# Patient Record
Sex: Female | Born: 1985 | Race: White | Hispanic: No | Marital: Married | Smoking: Never smoker
Health system: Southern US, Community
[De-identification: ages and names within clinical notes are randomized; demographics above are authoritative.]

## PROBLEM LIST (undated history)

## (undated) DIAGNOSIS — Z87442 Personal history of urinary calculi: Secondary | ICD-10-CM

## (undated) DIAGNOSIS — Z9049 Acquired absence of other specified parts of digestive tract: Secondary | ICD-10-CM

## (undated) DIAGNOSIS — N200 Calculus of kidney: Secondary | ICD-10-CM

## (undated) HISTORY — DX: Calculus of kidney: N20.0

## (undated) HISTORY — PX: CHOLECYSTECTOMY: SHX55

---
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
## 2007-06-15 ENCOUNTER — Encounter: Payer: Self-pay | Admitting: Maternal & Fetal Medicine

## 2007-07-07 ENCOUNTER — Ambulatory Visit: Payer: Self-pay | Admitting: Obstetrics and Gynecology

## 2007-07-10 ENCOUNTER — Ambulatory Visit: Payer: Self-pay | Admitting: Obstetrics and Gynecology

## 2007-08-31 ENCOUNTER — Ambulatory Visit: Payer: Self-pay | Admitting: Obstetrics and Gynecology

## 2007-08-31 ENCOUNTER — Ambulatory Visit: Payer: Self-pay

## 2007-12-22 ENCOUNTER — Observation Stay: Payer: Self-pay

## 2007-12-23 ENCOUNTER — Inpatient Hospital Stay: Payer: Self-pay

## 2008-01-10 ENCOUNTER — Emergency Department: Payer: Self-pay | Admitting: Emergency Medicine

## 2008-01-23 DIAGNOSIS — Z9049 Acquired absence of other specified parts of digestive tract: Secondary | ICD-10-CM

## 2008-01-23 HISTORY — DX: Acquired absence of other specified parts of digestive tract: Z90.49

## 2008-02-08 ENCOUNTER — Ambulatory Visit: Payer: Self-pay | Admitting: Surgery

## 2008-02-11 ENCOUNTER — Ambulatory Visit: Payer: Self-pay | Admitting: Surgery

## 2011-09-07 ENCOUNTER — Ambulatory Visit: Payer: Self-pay

## 2011-09-07 LAB — RAPID INFLUENZA A&B ANTIGENS

## 2013-09-25 ENCOUNTER — Ambulatory Visit: Payer: Self-pay | Admitting: Emergency Medicine

## 2013-09-25 LAB — URINALYSIS, COMPLETE
BILIRUBIN, UR: NEGATIVE
Glucose,UR: NEGATIVE mg/dL (ref 0–75)
KETONE: NEGATIVE
LEUKOCYTE ESTERASE: NEGATIVE
Nitrite: NEGATIVE
PROTEIN: NEGATIVE
Ph: 7 (ref 4.5–8.0)
Specific Gravity: 1.01 (ref 1.003–1.030)

## 2013-09-25 LAB — CBC WITH DIFFERENTIAL/PLATELET
Basophil #: 0.1 10*3/uL (ref 0.0–0.1)
Basophil %: 0.7 %
Eosinophil #: 0.2 10*3/uL (ref 0.0–0.7)
Eosinophil %: 1.8 %
HCT: 38.7 % (ref 35.0–47.0)
HGB: 13.1 g/dL (ref 12.0–16.0)
Lymphocyte #: 2.2 10*3/uL (ref 1.0–3.6)
Lymphocyte %: 24.6 %
MCH: 30.3 pg (ref 26.0–34.0)
MCHC: 34 g/dL (ref 32.0–36.0)
MCV: 89 fL (ref 80–100)
Monocyte #: 0.7 x10 3/mm (ref 0.2–0.9)
Monocyte %: 7.9 %
Neutrophil #: 5.9 10*3/uL (ref 1.4–6.5)
Neutrophil %: 65 %
Platelet: 264 10*3/uL (ref 150–440)
RBC: 4.33 10*6/uL (ref 3.80–5.20)
RDW: 12.4 % (ref 11.5–14.5)
WBC: 9.1 10*3/uL (ref 3.6–11.0)

## 2013-09-25 LAB — WET PREP, GENITAL

## 2013-09-25 LAB — GC/CHLAMYDIA PROBE AMP

## 2013-09-27 LAB — URINE CULTURE

## 2015-03-02 DIAGNOSIS — O09299 Supervision of pregnancy with other poor reproductive or obstetric history, unspecified trimester: Secondary | ICD-10-CM | POA: Insufficient documentation

## 2015-03-02 DIAGNOSIS — O200 Threatened abortion: Secondary | ICD-10-CM | POA: Insufficient documentation

## 2015-06-24 ENCOUNTER — Ambulatory Visit
Admission: EM | Admit: 2015-06-24 | Discharge: 2015-06-24 | Disposition: A | Discharge status: Home / Self Care | Payer: BLUE CROSS/BLUE SHIELD | Attending: Family Medicine | Admitting: Family Medicine

## 2015-06-24 ENCOUNTER — Encounter: Payer: Self-pay | Admitting: Emergency Medicine

## 2015-06-24 DIAGNOSIS — J069 Acute upper respiratory infection, unspecified: Secondary | ICD-10-CM | POA: Diagnosis not present

## 2015-06-24 LAB — RAPID STREP SCREEN (MED CTR MEBANE ONLY): STREPTOCOCCUS, GROUP A SCREEN (DIRECT): NEGATIVE

## 2015-06-24 MED ORDER — HYDROCOD POLST-CPM POLST ER 10-8 MG/5ML PO SUER: 5.0000 mL | Freq: Two times a day (BID) | ORAL | Status: DC

## 2015-06-24 NOTE — ED Provider Notes (Signed)
CSN: [id number]     Arrival date & time 06/24/15  1314 History   First MD Initiated Contact with Patient 06/24/15 1414     Chief Complaint  Patient presents with  . Sore Throat   (Consider location/radiation/quality/duration/timing/severity/associated sxs/prior Treatment) HPI   29 year old female resents with a 5 day history of a sore throat and cough with thick clear to yellow mucus. She had a fever of 99 is afebrile today. Pulse 92 respirations 14 blood pressure 112/73 O2 sat 96% on room air. She is coughing intermittently during the daytime and at nighttime is keeping her awake. Her father had the same symptoms that he is improved rapidly. He does list codeine as a allergy but states that that was when she was a very young child and her mother stated that she had hives. Not had any since that period time. She's never had any Vicodin in the past.  History reviewed. No pertinent past medical history. Past Surgical History  Procedure Laterality Date  . No past surgeries     No family history on file. Social History  Substance Use Topics  . Smoking status: Never Smoker   . Smokeless tobacco: Never Used  . Alcohol Use: No   OB History    No data available     Review of Systems  Constitutional: Positive for fever, chills and activity change. Negative for diaphoresis and fatigue.  HENT: Positive for congestion, postnasal drip, rhinorrhea, sinus pressure, sneezing and sore throat.   Respiratory: Positive for cough. Negative for shortness of breath, wheezing and stridor.   All other systems reviewed and are negative.   Allergies  Codeine  Home Medications   Prior to Admission medications   Medication Sig Start Date End Date Taking? Authorizing Provider  chlorpheniramine-HYDROcodone (TUSSIONEX PENNKINETIC ER) 10-8 MG/5ML SUER Take 5 mLs by mouth 2 (two) times daily. 06/24/15   Lutricia FeilWilliam P Evania Lyne, PA-C   Meds Ordered and Administered this Visit  Medications - No data to  display  BP 112/73 mmHg  Pulse 92  Temp(Src) 98.5 F (36.9 C)  Resp 14  Ht 5\' 5"  (1.651 m)  Wt 235 lb (106.595 kg)  BMI 39.11 kg/m2  SpO2 96%  LMP 06/20/2015 No data found.   Physical Exam  Constitutional: She is oriented to person, place, and time. She appears well-developed and well-nourished. No distress.  HENT:  Head: Normocephalic and atraumatic.  Right Ear: External ear normal.  Left Ear: External ear normal.  Nose: Nose normal.  Mouth/Throat: Oropharynx is clear and moist. No oropharyngeal exudate.  Eyes: Conjunctivae are normal. Pupils are equal, round, and reactive to light.  Neck: Normal range of motion. Neck supple.  Pulmonary/Chest: Effort normal and breath sounds normal. No respiratory distress. She has no wheezes. She has no rales.  Musculoskeletal: Normal range of motion. She exhibits no edema or tenderness.  Lymphadenopathy:    She has no cervical adenopathy.  Neurological: She is alert and oriented to person, place, and time.  Skin: Skin is warm and dry. She is not diaphoretic.  Psychiatric: She has a normal mood and affect. Her behavior is normal. Judgment and thought content normal.  Nursing note and vitals reviewed.   ED Course  Procedures (including critical care time)  Labs Review Labs Reviewed  RAPID STREP SCREEN (NOT AT [facility])  CULTURE, GROUP A STREP ([facility] ONLY)    Imaging Review No results found.   Visual Acuity Review  Right Eye Distance:   Left Eye Distance:   Bilateral  Distance:    Right Eye Near:   Left Eye Near:    Bilateral Near:         MDM   1. URI, acute    Discharge Medication List as of 06/24/2015  2:26 PM    START taking these medications   Details  chlorpheniramine-HYDROcodone (TUSSIONEX PENNKINETIC ER) 10-8 MG/5ML SUER Take 5 mLs by mouth 2 (two) times daily., Starting 06/24/2015, Until Discontinued, Print      Plan: 1. Test/x-ray results and diagnosis reviewed with patient 2. rx as per orders; risks,  benefits, potential side effects reviewed with patient 3. Recommend supportive treatment with rest and fluids. She has a viral. Antibiotics will be provided. For her cough I feel she would benefit from better rest with use of a hydrocodone cough syrup. Does have listed codeine as a allergy when she was a child. Denies not had any since that period of time. Prescribed the Dora Sims next the past her to take a very small dose initially of [place] teaspoon after she discussed it with her mother. They feel that it is not the risk then she will use Delsym or Robitussin instead. I told that many patients that list allergies to codeine unable to tolerate hydrocodone without a problem. Follow-up with her primary care next week if she continues to have problems 4. F/u prn if symptoms worsen or don't improve      Lutricia Feil, PA-C 06/24/15 1434

## 2015-06-24 NOTE — ED Notes (Signed)
Sore throat, cough for 5 days

## 2015-06-24 NOTE — Discharge Instructions (Signed)
Cool Mist Vaporizers °Vaporizers may help relieve the symptoms of a cough and cold. They add moisture to the air, which helps mucus to become thinner and less sticky. This makes it easier to breathe and cough up secretions. Cool mist vaporizers do not cause serious burns like hot mist vaporizers, which may also be called steamers or humidifiers. Vaporizers have not been proven to help with colds. You should not use a vaporizer if you are allergic to mold. °HOME CARE INSTRUCTIONS °· Follow the package instructions for the vaporizer. °· Do not use anything other than distilled water in the vaporizer. °· Do not run the vaporizer all of the time. This can cause mold or bacteria to grow in the vaporizer. °· Clean the vaporizer after each time it is used. °· Clean and dry the vaporizer well before storing it. °· Stop using the vaporizer if worsening respiratory symptoms develop. °  °This information is not intended to replace advice given to you by your health care provider. Make sure you discuss any questions you have with your health care provider. °  °Document Released: 03/07/2004 Document Revised: 06/15/2013 Document Reviewed: 10/28/2012 °Elsevier Interactive Patient Education ©2016 Elsevier Inc. ° °Upper Respiratory Infection, Adult °Most upper respiratory infections (URIs) are a viral infection of the air passages leading to the lungs. A URI affects the nose, throat, and upper air passages. The most common type of URI is nasopharyngitis and is typically referred to as "the common cold." °URIs run their course and usually go away on their own. Most of the time, a URI does not require medical attention, but sometimes a bacterial infection in the upper airways can follow a viral infection. This is called a secondary infection. Sinus and middle ear infections are common types of secondary upper respiratory infections. °Bacterial pneumonia can also complicate a URI. A URI can worsen asthma and chronic obstructive  pulmonary disease (COPD). Sometimes, these complications can require emergency medical care and may be life threatening.  °CAUSES °Almost all URIs are caused by viruses. A virus is a type of germ and can spread from one person to another.  °RISKS FACTORS °You may be at risk for a URI if:  °· You smoke.   °· You have chronic heart or lung disease. °· You have a weakened defense (immune) system.   °· You are very young or very old.   °· You have nasal allergies or asthma. °· You work in crowded or poorly ventilated areas. °· You work in health care facilities or schools. °SIGNS AND SYMPTOMS  °Symptoms typically develop 2-3 days after you come in contact with a cold virus. Most viral URIs last 7-10 days. However, viral URIs from the influenza virus (flu virus) can last 14-18 days and are typically more severe. Symptoms may include:  °· Runny or stuffy (congested) nose.   °· Sneezing.   °· Cough.   °· Sore throat.   °· Headache.   °· Fatigue.   °· Fever.   °· Loss of appetite.   °· Pain in your forehead, behind your eyes, and over your cheekbones (sinus pain). °· Muscle aches.   °DIAGNOSIS  °Your health care provider may diagnose a URI by: °· Physical exam. °· Tests to check that your symptoms are not due to another condition such as: °¨ Strep throat. °¨ Sinusitis. °¨ Pneumonia. °¨ Asthma. °TREATMENT  °A URI goes away on its own with time. It cannot be cured with medicines, but medicines may be prescribed or recommended to relieve symptoms. Medicines may help: °· Reduce your fever. °· Reduce   your cough. °· Relieve nasal congestion. °HOME CARE INSTRUCTIONS  °· Take medicines only as directed by your health care provider.   °· Gargle warm saltwater or take cough drops to comfort your throat as directed by your health care provider. °· Use a warm mist humidifier or inhale steam from a shower to increase air moisture. This may make it easier to breathe. °· Drink enough fluid to keep your urine clear or pale yellow.   °· Eat  soups and other clear broths and maintain good nutrition.   °· Rest as needed.   °· Return to work when your temperature has returned to normal or as your health care provider advises. You may need to stay home longer to avoid infecting others. You can also use a face mask and careful hand washing to prevent spread of the virus. °· Increase the usage of your inhaler if you have asthma.   °· Do not use any tobacco products, including cigarettes, chewing tobacco, or electronic cigarettes. If you need help quitting, ask your health care provider. °PREVENTION  °The best way to protect yourself from getting a cold is to practice good hygiene.  °· Avoid oral or hand contact with people with cold symptoms.   °· Wash your hands often if contact occurs.   °There is no clear evidence that vitamin C, vitamin E, echinacea, or exercise reduces the chance of developing a cold. However, it is always recommended to get plenty of rest, exercise, and practice good nutrition.  °SEEK MEDICAL CARE IF:  °· You are getting worse rather than better.   °· Your symptoms are not controlled by medicine.   °· You have chills. °· You have worsening shortness of breath. °· You have brown or red mucus. °· You have yellow or brown nasal discharge. °· You have pain in your face, especially when you bend forward. °· You have a fever. °· You have swollen neck glands. °· You have pain while swallowing. °· You have white areas in the back of your throat. °SEEK IMMEDIATE MEDICAL CARE IF:  °· You have severe or persistent: °¨ Headache. °¨ Ear pain. °¨ Sinus pain. °¨ Chest pain. °· You have chronic lung disease and any of the following: °¨ Wheezing. °¨ Prolonged cough. °¨ Coughing up blood. °¨ A change in your usual mucus. °· You have a stiff neck. °· You have changes in your: °¨ Vision. °¨ Hearing. °¨ Thinking. °¨ Mood. °MAKE SURE YOU:  °· Understand these instructions. °· Will watch your condition. °· Will get help right away if you are not doing well or  get worse. °  °This information is not intended to replace advice given to you by your health care provider. Make sure you discuss any questions you have with your health care provider. °  °Document Released: 12/04/2000 Document Revised: 10/25/2014 Document Reviewed: 09/15/2013 °Elsevier Interactive Patient Education ©2016 Elsevier Inc. ° °

## 2015-06-26 LAB — CULTURE, GROUP A STREP (THRC)

## 2015-06-27 ENCOUNTER — Telehealth: Payer: Self-pay | Admitting: Emergency Medicine

## 2015-11-30 ENCOUNTER — Ambulatory Visit
Admission: EM | Admit: 2015-11-30 | Discharge: 2015-11-30 | Disposition: A | Discharge status: Home / Self Care | Payer: BLUE CROSS/BLUE SHIELD | Attending: Family Medicine | Admitting: Family Medicine

## 2015-11-30 ENCOUNTER — Encounter: Payer: Self-pay | Admitting: *Deleted

## 2015-11-30 DIAGNOSIS — L255 Unspecified contact dermatitis due to plants, except food: Secondary | ICD-10-CM

## 2015-11-30 MED ORDER — PREDNISONE 10 MG (21) PO TBPK: ORAL_TABLET | ORAL | Status: DC

## 2015-11-30 NOTE — ED Notes (Signed)
Patient started having symptoms of rash from contact with poison ivy 4 days ago. Patient does have a history of severe reactions to poison ivy.

## 2015-11-30 NOTE — Discharge Instructions (Signed)
Contact Dermatitis Dermatitis is redness, soreness, and swelling (inflammation) of the skin. Contact dermatitis is a reaction to certain substances that touch the skin. You either touched something that irritated your skin, or you have allergies to something you touched.  HOME CARE  Skin Care  Moisturize your skin as needed.  Apply cool compresses to the affected areas.   Try taking a bath with:   Epsom salts. Follow the instructions on the package. You can get these at a pharmacy or grocery store.   Baking soda. Pour a small amount into the bath as told by your doctor.   Colloidal oatmeal. Follow the instructions on the package. You can get this at a pharmacy or grocery store.   Try applying baking soda paste to your skin. Stir water into baking soda until it looks like paste.  Do not scratch your skin.   Bathe less often.  Bathe in lukewarm water. Avoid using hot water.  Medicines  Take or apply over-the-counter and prescription medicines only as told by your doctor.   If you were prescribed an antibiotic medicine, take or apply your antibiotic as told by your doctor. Do not stop taking the antibiotic even if your condition starts to get better. General Instructions  Keep all follow-up visits as told by your doctor. This is important.   Avoid the substance that caused your reaction. If you do not know what caused it, keep a journal to try to track what caused it. Write down:   What you eat.   What cosmetic products you use.   What you drink.   What you wear in the affected area. This includes jewelry.   If you were given a bandage (dressing), take care of it as told by your doctor. This includes when to change and remove it.  GET HELP IF:   You do not get better with treatment.   Your condition gets worse.   You have signs of infection such as:  Swelling.  Tenderness.  Redness.  Soreness.  Warmth.   You have a fever.   You have new  symptoms.  GET HELP RIGHT AWAY IF:   You have a very bad headache.  You have neck pain.  Your neck is stiff.   You throw up (vomit).   You feel very sleepy.   You see red streaks coming from the affected area.   Your bone or joint underneath the affected area becomes painful after the skin has healed.   The affected area turns darker.   You have trouble breathing.    This information is not intended to replace advice given to you by your health care provider. Make sure you discuss any questions you have with your health care provider.   Document Released: 04/07/2009 Document Revised: 03/01/2015 Document Reviewed: 10/26/2014 Elsevier Interactive Patient Education 2016 [employer].  [facility] is a rash caused by touching the leaves of the poison [employer]. You may have a rash with redness and itching. Sometimes, blisters appear and break open. Your eyes may get puffy (swollen). Poison oak often heals in 2 to 3 weeks without treatment.  HOME CARE  If you touch poison oak:  Wash your skin with soap and water right away. Wash under your fingernails. Do not rub the skin very hard.  Wash any clothes you were wearing.  Avoid poison oak in the future. Poison oak usually has 3 leaves on a stem.  Use medicines to help with itching as  told by your doctor. Do not drive when you take this medicine.  Keep open sores dry, clean, and covered with a bandage and medicated cream, if needed.  Ask your doctor about medicine for children. GET HELP RIGHT AWAY IF:  You have open sores.  Redness spreads beyond the area of the rash.  There is yellowish white fluid (pus) coming from the rash.  Pain gets worse.  You have a temperature by mouth above 102 F (38.9 C), not controlled by medicine. MAKE SURE YOU:  Understand these instructions.  Will watch your condition.  Will get help right away if you are not doing well or get worse.   This information is not  intended to replace advice given to you by your health care provider. Make sure you discuss any questions you have with your health care provider.   Document Released: 07/13/2010 Document Revised: 09/02/2011 Document Reviewed: 11/16/2014 Elsevier Interactive Patient Education 2016 Elsevier Inc.  Rash A rash is a change in the color or feel of your skin. There are many different types of rashes. You may have other problems along with your rash. HOME CARE  Avoid the thing that caused your rash.  Do not scratch your rash.  You may take cools baths to help stop itching.  Only take medicines as told by your doctor.  Keep all doctor visits as told. GET HELP RIGHT AWAY IF:   Your pain, puffiness (swelling), or redness gets worse.  You have a fever.  You have new or severe problems.  You have body aches, watery poop (diarrhea), or you throw up (vomit).  Your rash is not better after 3 days. MAKE SURE YOU:   Understand these instructions.  Will watch your condition.  Will get help right away if you are not doing well or get worse.   This information is not intended to replace advice given to you by your health care provider. Make sure you discuss any questions you have with your health care provider.   Document Released: 11/27/2007 Document Revised: 09/02/2011 Document Reviewed: 10/26/2014 Elsevier Interactive Patient Education [employer] Elsevier Inc.

## 2015-11-30 NOTE — ED Provider Notes (Signed)
CSN: [id number]     Arrival date & time 11/30/15  1651 History   First MD Initiated Contact with Patient 11/30/15 1825    Nurses notes were reviewed. Chief Complaint  Patient presents with  . Poison Ivy   Patient reports being exposed to poison ivy and poison oak about 5 days ago. Rash is continuing to spread. Initially was on her right hand and spread to the left hand is also spread to both lower legs and she started notice lesions above both of her. She reports itching and pruritic rash as well.  Past historythat can past smoker history no past surgical history father has hyperlipidemia and patient never smoked and she is allergic to codeine.    (Consider location/radiation/quality/duration/timing/severity/associated sxs/prior Treatment) Patient is a 30 y.o. female presenting with poison ivy. The history is provided by the patient. No language interpreter was used.  Poison Rebecca Owens This is a new problem. The current episode started more than 2 days ago. The problem occurs constantly. The problem has been gradually worsening. Pertinent negatives include no chest pain, no abdominal pain, no headaches and no shortness of breath. Nothing aggravates the symptoms. She has tried nothing for the symptoms. The treatment provided no relief.    History reviewed. No pertinent past medical history. Past Surgical History  Procedure Laterality Date  . No past surgeries     Family History  Problem Relation Age of Onset  . Hyperlipidemia Father    Social History  Substance Use Topics  . Smoking status: Never Smoker   . Smokeless tobacco: Never Used  . Alcohol Use: No   OB History    No data available     Review of Systems  Respiratory: Negative for shortness of breath.   Cardiovascular: Negative for chest pain.  Gastrointestinal: Negative for abdominal pain.  Neurological: Negative for headaches.  All other systems reviewed and are negative.   Allergies  Codeine  Home Medications    Prior to Admission medications   Medication Sig Start Date End Date Taking? Authorizing Provider  chlorpheniramine-HYDROcodone (TUSSIONEX PENNKINETIC ER) 10-8 MG/5ML SUER Take 5 mLs by mouth 2 (two) times daily. 06/24/15   Rebecca Feil, PA-C  predniSONE (STERAPRED UNI-PAK 21 TAB) 10 MG (21) TBPK tablet Sig 6 tablet day 1, 5 tablets day 2, 4 tablets day 3,,3tablets day 4, 2 tablets day 5, 1 tablet day 6 take all tablets orally 11/30/15   Rebecca Rowan, MD   Meds Ordered and Administered this Visit  Medications - No data to display  BP 105/48 mmHg  Pulse 71  Temp(Src) 98 F (36.7 C) (Oral)  Resp 18  Ht  (1.651 m)  Wt 230 lb (104.327 kg)  BMI 38.27 kg/m2  SpO2 100%  LMP 11/16/2015 No data found.   Physical Exam  Constitutional: She is oriented to person, place, and time. She appears well-developed.  HENT:  Head: Normocephalic and atraumatic.  Eyes: Pupils are equal, round, and reactive to light.  Neck: Normal range of motion. Neck supple.  Pulmonary/Chest: Effort normal.  Abdominal: Bowel sounds are normal.  Musculoskeletal: Normal range of motion.  Neurological: She is alert and oriented to person, place, and time.  Skin: Skin is warm. Rash noted. There is erythema.  Psychiatric: She has a normal mood and affect.  Vitals reviewed.   ED Course  Procedures (including critical care time)  Labs Review Labs Reviewed - No data to display  Imaging Review No results found.   Visual Acuity Review  Right Eye Distance:   Left Eye Distance:   Bilateral Distance:    Right Eye Near:   Left Eye Near:    Bilateral Near:         MDM   1. Contact dermatitis due to plants, except food   Will place patient on steroid Dosepak 12 days. She states she has Zantac and Claritin home would not need that. She declined work note for today as well. She came after work. If she still continued to have trouble after 1-2 weeks follow-up with the PCP.   Note: This dictation was  prepared with Dragon dictation along with smaller phrase technology. Any transcriptional errors that result from this process are unintentional.        Rebecca RowanEugene Celestine Prim, MD 11/30/15 [phone]

## 2015-12-13 ENCOUNTER — Ambulatory Visit: Payer: Self-pay | Admitting: Family Medicine

## 2015-12-14 ENCOUNTER — Ambulatory Visit (INDEPENDENT_AMBULATORY_CARE_PROVIDER_SITE_OTHER): Payer: BLUE CROSS/BLUE SHIELD | Admitting: Family Medicine

## 2015-12-14 ENCOUNTER — Encounter: Payer: Self-pay | Admitting: Family Medicine

## 2015-12-14 VITALS — BP 108/70 | HR 68 | Ht 65.0 in | Wt 245.0 lb

## 2015-12-14 DIAGNOSIS — L259 Unspecified contact dermatitis, unspecified cause: Secondary | ICD-10-CM | POA: Diagnosis not present

## 2015-12-14 DIAGNOSIS — Z7689 Persons encountering health services in other specified circumstances: Secondary | ICD-10-CM

## 2015-12-14 DIAGNOSIS — O034 Incomplete spontaneous abortion without complication: Secondary | ICD-10-CM | POA: Insufficient documentation

## 2015-12-14 DIAGNOSIS — Z7189 Other specified counseling: Secondary | ICD-10-CM | POA: Diagnosis not present

## 2015-12-14 DIAGNOSIS — E669 Obesity, unspecified: Secondary | ICD-10-CM | POA: Insufficient documentation

## 2015-12-14 MED ORDER — PREDNISONE 10 MG PO TABS: ORAL_TABLET | ORAL | Status: DC

## 2015-12-14 NOTE — Progress Notes (Signed)
Name: Rebecca Owens   MRN: [id number]    DOB: 1986/05/07   Date:12/14/2015       Progress Note  Subjective  Chief Complaint  Chief Complaint  Patient presents with  . Establish Care  . Rash    had prednisone taper, finished last Wed.- has poison oak on arms, sides, and back    Rash This is a new problem. The current episode started in the past 7 days. The problem has been gradually improving since onset. The affected locations include the left arm, right arm and back. The rash is characterized by itchiness, redness and blistering. She was exposed to plant contact. Pertinent negatives include no congestion, cough, diarrhea, fever, joint pain, shortness of breath or sore throat. Past treatments include oral steroids (quick taper). The treatment provided mild relief.    No problem-specific assessment & plan notes found for this encounter.   No past medical history on file.  Past Surgical History  Procedure Laterality Date  . No past surgeries      Family History  Problem Relation Age of Onset  . Hyperlipidemia Father     Social History   Social History  . Marital Status: Single    Spouse Name: N/A  . Number of Children: N/A  . Years of Education: N/A   Occupational History  . Not on file.   Social History Main Topics  . Smoking status: Never Smoker   . Smokeless tobacco: Never Used  . Alcohol Use: No  . Drug Use: No  . Sexual Activity: Not on file   Other Topics Concern  . Not on file   Social History Narrative    Allergies  Allergen Reactions  . Codeine Hives     Review of Systems  Constitutional: Negative for fever, chills, weight loss and malaise/fatigue.  HENT: Negative for congestion, ear discharge, ear pain and sore throat.   Eyes: Negative for blurred vision.  Respiratory: Negative for cough, sputum production, shortness of breath and wheezing.   Cardiovascular: Negative for chest pain, palpitations and leg swelling.  Gastrointestinal: Negative  for heartburn, nausea, abdominal pain, diarrhea, constipation, blood in stool and melena.  Genitourinary: Negative for dysuria, urgency, frequency and hematuria.  Musculoskeletal: Negative for myalgias, back pain, joint pain and neck pain.  Skin: Positive for rash.  Neurological: Negative for dizziness, tingling, sensory change, focal weakness and headaches.  Endo/Heme/Allergies: Negative for environmental allergies and polydipsia. Does not bruise/bleed easily.  Psychiatric/Behavioral: Negative for depression and suicidal ideas. The patient is not nervous/anxious and does not have insomnia.      Objective  Filed Vitals:   12/14/15 1357  BP: 108/70  Pulse: 68  Height: 5\' 5"  (1.651 m)  Weight: 245 lb (111.131 kg)    Physical Exam  Constitutional: She is well-developed, well-nourished, and in no distress. No distress.  HENT:  Head: Normocephalic and atraumatic.  Right Ear: External ear normal.  Left Ear: External ear normal.  Nose: Nose normal.  Mouth/Throat: Oropharynx is clear and moist.  Eyes: Conjunctivae and EOM are normal. Pupils are equal, round, and reactive to light. Right eye exhibits no discharge. Left eye exhibits no discharge.  Neck: Normal range of motion. Neck supple. No JVD present. No thyromegaly present.  Cardiovascular: Normal rate, regular rhythm, normal heart sounds and intact distal pulses.  Exam reveals no gallop and no friction rub.   No murmur heard. Pulmonary/Chest: Effort normal and breath sounds normal.  Abdominal: Soft. Bowel sounds are normal. She exhibits no mass.  There is no tenderness. There is no guarding.  Musculoskeletal: Normal range of motion. She exhibits no edema.  Lymphadenopathy:    She has no cervical adenopathy.  Neurological: She is alert.  Skin: Skin is warm and dry. She is not diaphoretic.  Psychiatric: Mood and affect normal.  Nursing note and vitals reviewed.     Assessment & Plan  Problem List Items Addressed This Visit     None    Visit Diagnoses    Encounter to establish care with new doctor    -  Primary    Contact dermatitis        Relevant Medications    predniSONE (DELTASONE) 10 MG tablet         Dr. Hayden Rasmusseneanna Cesiah [facility]  12/14/2015

## 2016-03-19 ENCOUNTER — Ambulatory Visit
Admission: RE | Admit: 2016-03-19 | Discharge: 2016-03-19 | Disposition: A | Discharge status: Home / Self Care | Payer: BLUE CROSS/BLUE SHIELD | Source: Ambulatory Visit | Attending: Family Medicine | Admitting: Family Medicine

## 2016-03-19 ENCOUNTER — Ambulatory Visit (INDEPENDENT_AMBULATORY_CARE_PROVIDER_SITE_OTHER): Payer: BLUE CROSS/BLUE SHIELD | Admitting: Family Medicine

## 2016-03-19 ENCOUNTER — Encounter: Payer: Self-pay | Admitting: Family Medicine

## 2016-03-19 VITALS — BP 110/62 | HR 66 | Ht 65.0 in | Wt 249.0 lb

## 2016-03-19 DIAGNOSIS — M5441 Lumbago with sciatica, right side: Secondary | ICD-10-CM

## 2016-03-19 DIAGNOSIS — N200 Calculus of kidney: Secondary | ICD-10-CM | POA: Insufficient documentation

## 2016-03-19 MED ORDER — ETODOLAC 500 MG PO TABS: 500.0000 mg | ORAL_TABLET | Freq: Two times a day (BID) | ORAL | 1 refills | Status: DC

## 2016-03-19 MED ORDER — CYCLOBENZAPRINE HCL 10 MG PO TABS: 10.0000 mg | ORAL_TABLET | Freq: Three times a day (TID) | ORAL | 0 refills | Status: DC | PRN

## 2016-03-19 NOTE — Progress Notes (Signed)
Name: Rebecca Owens   MRN: [id number]    DOB: 20-May-1986   Date:03/19/2016       Progress Note  Subjective  Chief Complaint  Chief Complaint  Patient presents with  . Back Pain    lower right back pain radiating down right leg/ spasm    Back Pain  This is a chronic problem. The current episode started more than 1 month ago. The problem occurs constantly. The problem is unchanged. The pain is present in the lumbar spine. The quality of the pain is described as aching. The pain radiates to the right knee and right thigh (right gluteal). The pain is at a severity of 8/10. The pain is moderate. The symptoms are aggravated by sitting. Associated symptoms include leg pain and paresthesias. Pertinent negatives include no abdominal pain, bladder incontinence, bowel incontinence, chest pain, dysuria, fever, headaches, numbness, paresis, perianal numbness, tingling, weakness or weight loss. She has tried nothing for the symptoms. The treatment provided mild relief.    No problem-specific Assessment & Plan notes found for this encounter.   History reviewed. No pertinent past medical history.  Past Surgical History:  Procedure Laterality Date  . NO PAST SURGERIES      Family History  Problem Relation Age of Onset  . Hyperlipidemia Father     Social History   Social History  . Marital status: Single    Spouse name: N/A  . Number of children: N/A  . Years of education: N/A   Occupational History  . Not on file.   Social History Main Topics  . Smoking status: Never Smoker  . Smokeless tobacco: Never Used  . Alcohol use No  . Drug use: No  . Sexual activity: Not on file   Other Topics Concern  . Not on file   Social History Narrative  . No narrative on file    Allergies  Allergen Reactions  . Codeine Hives     Review of Systems  Constitutional: Negative for chills, fever, malaise/fatigue and weight loss.  HENT: Negative for ear discharge, ear pain and sore throat.    Eyes: Negative for blurred vision.  Respiratory: Negative for cough, sputum production, shortness of breath and wheezing.   Cardiovascular: Negative for chest pain, palpitations and leg swelling.  Gastrointestinal: Negative for abdominal pain, blood in stool, bowel incontinence, constipation, diarrhea, heartburn, melena and nausea.  Genitourinary: Negative for bladder incontinence, dysuria, frequency, hematuria and urgency.  Musculoskeletal: Positive for back pain. Negative for joint pain, myalgias and neck pain.  Skin: Negative for rash.  Neurological: Positive for paresthesias. Negative for dizziness, tingling, sensory change, focal weakness, weakness, numbness and headaches.  Endo/Heme/Allergies: Negative for environmental allergies and polydipsia. Does not bruise/bleed easily.  Psychiatric/Behavioral: Negative for depression and suicidal ideas. The patient is not nervous/anxious and does not have insomnia.      Objective  Vitals:   03/19/16 1455  BP: 110/62  Pulse: 66  Weight: 249 lb (112.9 kg)  Height: 5\' 5"  (1.651 m)    Physical Exam  Constitutional: She is well-developed, well-nourished, and in no distress. No distress.  HENT:  Head: Normocephalic and atraumatic.  Right Ear: External ear normal.  Left Ear: External ear normal.  Nose: Nose normal.  Mouth/Throat: Oropharynx is clear and moist.  Eyes: Conjunctivae and EOM are normal. Pupils are equal, round, and reactive to light. Right eye exhibits no discharge. Left eye exhibits no discharge.  Neck: Normal range of motion. Neck supple. No JVD present. No thyromegaly present.  Cardiovascular: Normal rate, regular rhythm, normal heart sounds and intact distal pulses.  Exam reveals no gallop and no friction rub.   No murmur heard. Pulmonary/Chest: Effort normal and breath sounds normal. She has no wheezes. She has no rales.  Abdominal: Soft. Bowel sounds are normal. She exhibits no mass. There is no tenderness. There is no  guarding.  Musculoskeletal: Normal range of motion. She exhibits no edema.       Lumbar back: She exhibits spasm.  Lymphadenopathy:    She has no cervical adenopathy.  Neurological: She is alert. She has normal motor skills, normal sensation, normal strength and normal reflexes. She has an abnormal Straight Leg Raise Test.  Skin: Skin is warm and dry. She is not diaphoretic.  Psychiatric: Mood and affect normal.      Assessment & Plan  Problem List Items Addressed This Visit    None    Visit Diagnoses    Right-sided low back pain with right-sided sciatica    -  Primary   Relevant Medications   etodolac (LODINE) 500 MG tablet   cyclobenzaprine (FLEXERIL) 10 MG tablet   Other Relevant Orders   DG Lumbar Spine Complete        Dr. Hayden Rasmusseneanna Algie [facility]  03/19/16

## 2016-03-20 ENCOUNTER — Other Ambulatory Visit: Payer: Self-pay

## 2016-04-04 ENCOUNTER — Other Ambulatory Visit: Payer: Self-pay

## 2016-04-04 DIAGNOSIS — M545 Low back pain: Secondary | ICD-10-CM

## 2016-05-14 ENCOUNTER — Other Ambulatory Visit: Payer: Self-pay | Admitting: Orthopedic Surgery

## 2016-05-14 DIAGNOSIS — M545 Low back pain: Secondary | ICD-10-CM

## 2016-05-31 ENCOUNTER — Ambulatory Visit: Payer: BLUE CROSS/BLUE SHIELD

## 2016-06-07 ENCOUNTER — Ambulatory Visit: Payer: BLUE CROSS/BLUE SHIELD

## 2017-01-28 ENCOUNTER — Other Ambulatory Visit: Payer: Self-pay | Admitting: Orthopedic Surgery

## 2017-01-28 DIAGNOSIS — M5417 Radiculopathy, lumbosacral region: Secondary | ICD-10-CM

## 2017-02-04 ENCOUNTER — Ambulatory Visit
Admission: RE | Admit: 2017-02-04 | Discharge: 2017-02-04 | Disposition: A | Discharge status: Home / Self Care | Payer: No Typology Code available for payment source | Source: Ambulatory Visit | Attending: Orthopedic Surgery | Admitting: Orthopedic Surgery

## 2017-02-04 DIAGNOSIS — M5417 Radiculopathy, lumbosacral region: Secondary | ICD-10-CM

## 2017-02-04 MED ORDER — IOPAMIDOL (ISOVUE-M 200) INJECTION 41%
1.0000 mL | Freq: Once | INTRAMUSCULAR | Status: AC
  Administered 2017-02-04: 1 mL via EPIDURAL

## 2017-02-04 MED ORDER — METHYLPREDNISOLONE ACETATE 40 MG/ML INJ SUSP (RADIOLOG
120.0000 mg | Freq: Once | INTRAMUSCULAR | Status: AC
  Administered 2017-02-04: 120 mg via EPIDURAL

## 2017-02-04 NOTE — Discharge Instructions (Signed)

## 2017-05-09 ENCOUNTER — Encounter: Payer: Self-pay | Admitting: Family Medicine

## 2017-05-09 ENCOUNTER — Ambulatory Visit (INDEPENDENT_AMBULATORY_CARE_PROVIDER_SITE_OTHER): Payer: Self-pay | Admitting: Family Medicine

## 2017-05-09 VITALS — BP 120/80 | HR 70 | Ht 65.0 in | Wt 251.0 lb

## 2017-05-09 DIAGNOSIS — J40 Bronchitis, not specified as acute or chronic: Secondary | ICD-10-CM

## 2017-05-09 DIAGNOSIS — J029 Acute pharyngitis, unspecified: Secondary | ICD-10-CM

## 2017-05-09 MED ORDER — AMOXICILLIN-POT CLAVULANATE 875-125 MG PO TABS: 1.0000 | ORAL_TABLET | Freq: Two times a day (BID) | ORAL | 0 refills | Status: DC

## 2017-05-09 NOTE — Progress Notes (Signed)
Name: Rebecca Owens   MRN: [id number]    DOB: [birth year]   Date:05/09/2017       Progress Note  Subjective  Chief Complaint  Chief Complaint  Patient presents with  . Sinusitis    started with a cough and now has a sore throat and is hoarse- little production in am    Sinusitis  This is a new problem. The current episode started in the past 7 days. The problem has been gradually worsening since onset. There has been no fever. Associated symptoms include coughing, a hoarse voice and a sore throat. Pertinent negatives include no chills, congestion, diaphoresis, ear pain, headaches, neck pain, shortness of breath or sinus pressure. Past treatments include oral decongestants.  Cough  This is a new problem. The current episode started in the past 7 days. The problem has been waxing and waning. Cough characteristics: "little yellow" Associated symptoms include a sore throat. Pertinent negatives include no chest pain, chills, ear pain, fever, headaches, heartburn, myalgias, nasal congestion, postnasal drip, rash, shortness of breath, weight loss or wheezing. She has tried nothing for the symptoms. There is no history of asthma or environmental allergies.  Sore Throat   This is a new problem. The current episode started in the past 7 days. The problem has been gradually worsening. Associated symptoms include coughing and a hoarse voice. Pertinent negatives include no abdominal pain, congestion, diarrhea, ear discharge, ear pain, headaches, neck pain or shortness of breath.    No problem-specific Assessment & Plan notes found for this encounter.   History reviewed. No pertinent past medical history.  Past Surgical History:  Procedure Laterality Date  . NO PAST SURGERIES      Family History  Problem Relation Age of Onset  . Hyperlipidemia Father     Social History   Socioeconomic History  . Marital status: Single    Spouse name: Not on file  . Number of children: Not on file  . Years  of education: Not on file  . Highest education level: Not on file  Social Needs  . Financial resource strain: Not on file  . Food insecurity - worry: Not on file  . Food insecurity - inability: Not on file  . Transportation needs - medical: Not on file  . Transportation needs - non-medical: Not on file  Occupational History  . Not on file  Tobacco Use  . Smoking status: Never Smoker  . Smokeless tobacco: Never Used  Substance and Sexual Activity  . Alcohol use: No  . Drug use: No  . Sexual activity: Not on file  Other Topics Concern  . Not on file  Social History Narrative  . Not on file    Allergies  Allergen Reactions  . Codeine Hives    Outpatient Medications Prior to Visit  Medication Sig Dispense Refill  . cyclobenzaprine (FLEXERIL) 10 MG tablet Take 1 tablet (10 mg total) by mouth 3 (three) times daily as needed for muscle spasms. 30 tablet 0  . DOCOSAHEXAENOIC ACID PO Take by mouth.    . etodolac (LODINE) 500 MG tablet Take 1 tablet (500 mg total) by mouth 2 (two) times daily. 60 tablet 1  . predniSONE (DELTASONE) 10 MG tablet Taper 6,6,6,5,5,5,4,4,3,3,2,2,1,1 (Patient not taking: Reported on 03/19/2016) 53 tablet 1   No facility-administered medications prior to visit.     Review of Systems  Constitutional: Negative for chills, diaphoresis, fever, malaise/fatigue and weight loss.  HENT: Positive for hoarse voice and sore throat. Negative for  congestion, ear discharge, ear pain, postnasal drip and sinus pressure.   Eyes: Negative for blurred vision.  Respiratory: Positive for cough. Negative for sputum production, shortness of breath and wheezing.   Cardiovascular: Negative for chest pain, palpitations and leg swelling.  Gastrointestinal: Negative for abdominal pain, blood in stool, constipation, diarrhea, heartburn, melena and nausea.  Genitourinary: Negative for dysuria, frequency, hematuria and urgency.  Musculoskeletal: Negative for back pain, joint pain,  myalgias and neck pain.  Skin: Negative for rash.  Neurological: Negative for dizziness, tingling, sensory change, focal weakness and headaches.  Endo/Heme/Allergies: Negative for environmental allergies and polydipsia. Does not bruise/bleed easily.  Psychiatric/Behavioral: Negative for depression and suicidal ideas. The patient is not nervous/anxious and does not have insomnia.      Objective  Vitals:   05/09/17 1522  BP: 120/80  Pulse: 70  Weight: 251 lb (113.9 kg)  Height: 5\' 5"  (1.651 m)    Physical Exam  Constitutional: She is well-developed, well-nourished, and in no distress. No distress.  HENT:  Head: Normocephalic and atraumatic.  Right Ear: Tympanic membrane, external ear and ear canal normal.  Left Ear: Tympanic membrane, external ear and ear canal normal.  Nose: Nose normal.  Mouth/Throat: Posterior oropharyngeal erythema present. No oropharyngeal exudate or posterior oropharyngeal edema.  Eyes: Conjunctivae and EOM are normal. Pupils are equal, round, and reactive to light. Right eye exhibits no discharge. Left eye exhibits no discharge.  Neck: Normal range of motion. Neck supple. No JVD present. No thyromegaly present.  Cardiovascular: Normal rate, regular rhythm, normal heart sounds and intact distal pulses. Exam reveals no gallop and no friction rub.  No murmur heard. Pulmonary/Chest: Effort normal and breath sounds normal. She has no wheezes. She has no rales.  Abdominal: Soft. Bowel sounds are normal. She exhibits no mass. There is no tenderness. There is no guarding.  Musculoskeletal: Normal range of motion. She exhibits no edema.  Lymphadenopathy:       Head (right side): No submandibular adenopathy present.       Head (left side): No submandibular adenopathy present.    She has no cervical adenopathy.  Neurological: She is alert. She has normal reflexes.  Skin: Skin is warm and dry. She is not diaphoretic.  Psychiatric: Mood and affect normal.  Nursing  note and vitals reviewed.     Assessment & Plan  Problem List Items Addressed This Visit    None    Visit Diagnoses    Bronchitis    -  Primary   mucinex dm for cough   Relevant Medications   amoxicillin-clavulanate (AUGMENTIN) 875-125 MG tablet   Pharyngitis, unspecified etiology          Meds ordered this encounter  Medications  . amoxicillin-clavulanate (AUGMENTIN) 875-125 MG tablet    Sig: Take 1 tablet 2 (two) times daily by mouth.    Dispense:  20 tablet    Refill:  0      Dr. [facility]reanna [facility]  05/09/17

## 2017-09-04 ENCOUNTER — Other Ambulatory Visit: Payer: Self-pay

## 2017-09-05 ENCOUNTER — Ambulatory Visit: Payer: Self-pay | Admitting: Family Medicine

## 2017-09-08 ENCOUNTER — Ambulatory Visit (INDEPENDENT_AMBULATORY_CARE_PROVIDER_SITE_OTHER): Payer: Self-pay | Admitting: Family Medicine

## 2017-09-08 ENCOUNTER — Encounter: Payer: Self-pay | Admitting: Family Medicine

## 2017-09-08 VITALS — BP 118/70 | HR 108 | Temp 98.9°F | Resp 14 | Ht 65.0 in | Wt 249.0 lb

## 2017-09-08 DIAGNOSIS — J321 Chronic frontal sinusitis: Secondary | ICD-10-CM

## 2017-09-08 DIAGNOSIS — H6503 Acute serous otitis media, bilateral: Secondary | ICD-10-CM

## 2017-09-08 DIAGNOSIS — R059 Cough, unspecified: Secondary | ICD-10-CM

## 2017-09-08 DIAGNOSIS — R05 Cough: Secondary | ICD-10-CM

## 2017-09-08 DIAGNOSIS — N910 Primary amenorrhea: Secondary | ICD-10-CM

## 2017-09-08 LAB — POCT URINE PREGNANCY: Preg Test, Ur: NEGATIVE

## 2017-09-08 NOTE — Progress Notes (Signed)
Name: Rebecca Owens   MRN: [id number]    DOB: 11-03-1985   Date:09/08/2017       Progress Note  Subjective  Chief Complaint  Chief Complaint  Patient presents with  . Follow-up    was seen in urgent care yasterday for URI- only gave inhaler due to "unsure if pregnant" unable to keep anything down/ vomitting and has cough    Cough  This is a new problem. The current episode started in the past 7 days. The problem has been gradually worsening. The cough is productive of purulent sputum (prod green/yellow). Associated symptoms include chest pain, ear congestion, ear pain, headaches, myalgias, nasal congestion, postnasal drip, rhinorrhea, a sore throat, shortness of breath, sweats and wheezing. Pertinent negatives include no chills, fever, heartburn, hemoptysis, rash or weight loss. Associated symptoms comments: Sternal discomfort/pain behind eyes/postnasal blood tinged/. The symptoms are aggravated by lying down. She has tried a beta-agonist inhaler (tuss x not taking) for the symptoms. There is no history of asthma, bronchiectasis, COPD or environmental allergies.    No problem-specific Assessment & Plan notes found for this encounter.   No past medical history on file.  Past Surgical History:  Procedure Laterality Date  . NO PAST SURGERIES      Family History  Problem Relation Age of Onset  . Hyperlipidemia Father     Social History   Socioeconomic History  . Marital status: Single    Spouse name: Not on file  . Number of children: Not on file  . Years of education: Not on file  . Highest education level: Not on file  Social Needs  . Financial resource strain: Not on file  . Food insecurity - worry: Not on file  . Food insecurity - inability: Not on file  . Transportation needs - medical: Not on file  . Transportation needs - non-medical: Not on file  Occupational History  . Not on file  Tobacco Use  . Smoking status: Never Smoker  . Smokeless tobacco: Never Used   Substance and Sexual Activity  . Alcohol use: No  . Drug use: No  . Sexual activity: Not on file  Other Topics Concern  . Not on file  Social History Narrative  . Not on file    Allergies  Allergen Reactions  . Codeine Hives    Outpatient Medications Prior to Visit  Medication Sig Dispense Refill  . amoxicillin-clavulanate (AUGMENTIN) 875-125 MG tablet Take 1 tablet 2 (two) times daily by mouth. 20 tablet 0   No facility-administered medications prior to visit.     Review of Systems  Constitutional: Negative for chills, fever, malaise/fatigue and weight loss.  HENT: Positive for ear pain, postnasal drip, rhinorrhea and sore throat. Negative for ear discharge.   Eyes: Negative for blurred vision.  Respiratory: Positive for cough, shortness of breath and wheezing. Negative for hemoptysis and sputum production.   Cardiovascular: Positive for chest pain. Negative for palpitations and leg swelling.  Gastrointestinal: Negative for abdominal pain, blood in stool, constipation, diarrhea, heartburn, melena and nausea.  Genitourinary: Negative for dysuria, frequency, hematuria and urgency.  Musculoskeletal: Positive for myalgias. Negative for back pain, joint pain and neck pain.  Skin: Negative for rash.  Neurological: Positive for headaches. Negative for dizziness, tingling, sensory change and focal weakness.  Endo/Heme/Allergies: Negative for environmental allergies and polydipsia. Does not bruise/bleed easily.  Psychiatric/Behavioral: Negative for depression and suicidal ideas. The patient is not nervous/anxious and does not have insomnia.      Objective  Vitals:   09/08/17 1443  BP: 118/70  Pulse: (!) 108  Resp: 14  Temp: 98.9 F (37.2 C)  TempSrc: Oral  SpO2: 94%  Weight: 249 lb (112.9 kg)  Height: 5\' 5"  (1.651 m)    Physical Exam  Constitutional: She is well-developed, well-nourished, and in no distress. No distress.  HENT:  Head: Normocephalic and atraumatic.   Right Ear: External ear and ear canal normal. A middle ear effusion is present.  Left Ear: External ear and ear canal normal.  Nose: No mucosal edema or rhinorrhea. Right sinus exhibits frontal sinus tenderness. Right sinus exhibits no maxillary sinus tenderness. Left sinus exhibits frontal sinus tenderness. Left sinus exhibits no maxillary sinus tenderness.  Mouth/Throat: Uvula is midline, oropharynx is clear and moist and mucous membranes are normal. No oropharyngeal exudate, posterior oropharyngeal edema or posterior oropharyngeal erythema.  Eyes: Conjunctivae, EOM and lids are normal. Pupils are equal, round, and reactive to light. Right eye exhibits no discharge. Left eye exhibits no discharge. Right conjunctiva is not injected. Left conjunctiva is not injected.  Neck: Trachea normal and normal range of motion. Neck supple. Normal carotid pulses, no hepatojugular reflux and no JVD present. Carotid bruit is not present. No thyromegaly present.  Cardiovascular: Normal rate, regular rhythm, S1 normal, S2 normal, normal heart sounds and intact distal pulses. Exam reveals no gallop, no S3, no S4 and no friction rub.  No murmur heard. Pulmonary/Chest: Effort normal and breath sounds normal. No accessory muscle usage. No respiratory distress. She has no decreased breath sounds. She has no wheezes. She has no rhonchi. She has no rales.  Abdominal: Soft. Bowel sounds are normal. She exhibits no mass. There is no hepatosplenomegaly. There is no tenderness. There is no guarding.  Musculoskeletal: Normal range of motion. She exhibits no edema.  Lymphadenopathy:       Head (right side): No submental and no submandibular adenopathy present.       Head (left side): No submental and no submandibular adenopathy present.    She has no cervical adenopathy.  Neurological: She is alert. She has normal reflexes.  Skin: Skin is warm and dry. She is not diaphoretic. No cyanosis.  Psychiatric: Mood and affect normal.   Nursing note and vitals reviewed.     Assessment & Plan  Problem List Items Addressed This Visit    None    Visit Diagnoses    Cough    -  Primary   pulse ox 94%(97% yesterday)/referral to [facility] for r/o pneumonia/persistent/multiple urgent care visits   Frontal sinusitis, unspecified chronicity       tenderness over frontal sinus   Delayed period       urine pregnancy test negative   Relevant Orders   POCT urine pregnancy (Completed)   Bilateral acute serous otitis media, recurrence not specified          No orders of the defined types were placed in this encounter.     Dr. Hayden Rasmusseneanna Jones [facility]  09/08/17

## 2017-09-09 DIAGNOSIS — J101 Influenza due to other identified influenza virus with other respiratory manifestations: Secondary | ICD-10-CM

## 2017-09-09 HISTORY — DX: Influenza due to other identified influenza virus with other respiratory manifestations: J10.1

## 2017-09-11 ENCOUNTER — Other Ambulatory Visit: Payer: Self-pay

## 2017-09-11 ENCOUNTER — Encounter: Payer: Self-pay | Admitting: Family Medicine

## 2017-09-11 ENCOUNTER — Ambulatory Visit (INDEPENDENT_AMBULATORY_CARE_PROVIDER_SITE_OTHER): Payer: Self-pay | Admitting: Family Medicine

## 2017-09-11 VITALS — BP 120/70 | HR 80 | Ht 65.0 in | Wt 252.0 lb

## 2017-09-11 DIAGNOSIS — J309 Allergic rhinitis, unspecified: Secondary | ICD-10-CM

## 2017-09-11 DIAGNOSIS — H66002 Acute suppurative otitis media without spontaneous rupture of ear drum, left ear: Secondary | ICD-10-CM

## 2017-09-11 MED ORDER — AZITHROMYCIN 250 MG PO TABS: ORAL_TABLET | ORAL | 0 refills | Status: DC

## 2017-09-11 NOTE — Progress Notes (Signed)
Name: Rebecca Owens   MRN: [id number]    DOB: [birth year]   Date:09/11/2017       Progress Note  Subjective  Chief Complaint  Chief Complaint  Patient presents with  . Follow-up    tested positive for flu- no antibiotics were given. pt now is having L) ear pain and no voice. Having a cough as well    Otalgia   There is pain in the left ear. This is a new problem. The current episode started in the past 7 days (monday/worse yesterday). The problem occurs every few minutes. The problem has been waxing and waning. There has been no fever. The fever has been present for 1 to 2 days. The pain is moderate. Associated symptoms include coughing, headaches, rhinorrhea and a sore throat. Pertinent negatives include no abdominal pain, diarrhea, ear discharge, hearing loss, neck pain, rash or vomiting. She has tried nothing for the symptoms.    No problem-specific Assessment & Plan notes found for this encounter.   No past medical history on file.  Past Surgical History:  Procedure Laterality Date  . NO PAST SURGERIES      Family History  Problem Relation Age of Onset  . Hyperlipidemia Father     Social History   Socioeconomic History  . Marital status: Single    Spouse name: Not on file  . Number of children: Not on file  . Years of education: Not on file  . Highest education level: Not on file  Occupational History  . Not on file  Social Needs  . Financial resource strain: Not on file  . Food insecurity:    Worry: Not on file    Inability: Not on file  . Transportation needs:    Medical: Not on file    Non-medical: Not on file  Tobacco Use  . Smoking status: Never Smoker  . Smokeless tobacco: Never Used  Substance and Sexual Activity  . Alcohol use: No  . Drug use: No  . Sexual activity: Not on file  Lifestyle  . Physical activity:    Days per week: Not on file    Minutes per session: Not on file  . Stress: Not on file  Relationships  . Social connections:    Talks  on phone: Not on file    Gets together: Not on file    Attends religious service: Not on file    Active member of club or organization: Not on file    Attends meetings of clubs or organizations: Not on file    Relationship status: Not on file  . Intimate partner violence:    Fear of current or ex partner: Not on file    Emotionally abused: Not on file    Physically abused: Not on file    Forced sexual activity: Not on file  Other Topics Concern  . Not on file  Social History Narrative  . Not on file    Allergies  Allergen Reactions  . Codeine Hives    No outpatient medications prior to visit.   No facility-administered medications prior to visit.     Review of Systems  Constitutional: Negative for chills, fever, malaise/fatigue and weight loss.  HENT: Positive for ear pain, rhinorrhea and sore throat. Negative for ear discharge and hearing loss.   Eyes: Negative for blurred vision.  Respiratory: Positive for cough. Negative for sputum production, shortness of breath and wheezing.   Cardiovascular: Negative for chest pain, palpitations and leg swelling.  Gastrointestinal:  Negative for abdominal pain, blood in stool, constipation, diarrhea, heartburn, melena, nausea and vomiting.  Genitourinary: Negative for dysuria, frequency, hematuria and urgency.  Musculoskeletal: Negative for back pain, joint pain, myalgias and neck pain.  Skin: Negative for rash.  Neurological: Positive for headaches. Negative for dizziness, tingling, sensory change and focal weakness.  Endo/Heme/Allergies: Negative for environmental allergies and polydipsia. Does not bruise/bleed easily.  Psychiatric/Behavioral: Negative for depression and suicidal ideas. The patient is not nervous/anxious and does not have insomnia.      Objective  Vitals:   09/11/17 1346  BP: 120/70  Pulse: 80  Weight: 252 lb (114.3 kg)  Height: 5\' 5"  (1.651 m)    Physical Exam  Constitutional: She is well-developed,  well-nourished, and in no distress. No distress.  HENT:  Head: Normocephalic and atraumatic.  Right Ear: Tympanic membrane, external ear and ear canal normal.  Left Ear: External ear normal. Tympanic membrane is erythematous.  Nose: Right sinus exhibits no maxillary sinus tenderness and no frontal sinus tenderness. Left sinus exhibits maxillary sinus tenderness. Left sinus exhibits no frontal sinus tenderness.  Mouth/Throat: Oropharynx is clear and moist.  Eyes: Pupils are equal, round, and reactive to light. Conjunctivae and EOM are normal. Right eye exhibits no discharge. Left eye exhibits no discharge.  Neck: Normal range of motion. Neck supple. No JVD present. No thyromegaly present.  Cardiovascular: Normal rate, regular rhythm, normal heart sounds and intact distal pulses. Exam reveals no gallop and no friction rub.  No murmur heard. Pulmonary/Chest: Effort normal and breath sounds normal. She has no wheezes. She has no rales.  Abdominal: Soft. Bowel sounds are normal. She exhibits no mass. There is no tenderness. There is no guarding.  Musculoskeletal: Normal range of motion. She exhibits no edema.  Lymphadenopathy:       Head (right side): Submandibular adenopathy present.       Head (left side): Submandibular adenopathy present.    She has no cervical adenopathy.  tender  Neurological: She is alert. She has normal reflexes.  Skin: Skin is warm and dry. She is not diaphoretic.  Psychiatric: Mood and affect normal.  Nursing note and vitals reviewed.     Assessment & Plan  Problem List Items Addressed This Visit    None    Visit Diagnoses    Acute suppurative otitis media of left ear without spontaneous rupture of tympanic membrane, recurrence not specified    -  Primary   Relevant Medications   azithromycin (ZITHROMAX) 250 MG tablet   Allergic sinusitis       Relevant Medications   azithromycin (ZITHROMAX) 250 MG tablet      Meds ordered this encounter  Medications   . azithromycin (ZITHROMAX) 250 MG tablet    Sig: 2 today then 1 a day for 4 days    Dispense:  6 tablet    Refill:  0      Dr. Hayden Rasmusseneanna Arilyn Brierley [facility]  09/11/17

## 2017-09-16 ENCOUNTER — Other Ambulatory Visit: Payer: Self-pay

## 2018-05-01 ENCOUNTER — Encounter
Admission: RE | Disposition: A | Discharge status: Home / Self Care | Payer: Self-pay | Source: Ambulatory Visit | Attending: Obstetrics and Gynecology

## 2018-05-01 ENCOUNTER — Ambulatory Visit
Admission: RE | Admit: 2018-05-01 | Discharge: 2018-05-01 | Disposition: A | Discharge status: Home / Self Care | Payer: 59 | Source: Ambulatory Visit | Attending: Obstetrics and Gynecology | Admitting: Obstetrics and Gynecology

## 2018-05-01 ENCOUNTER — Ambulatory Visit: Payer: 59 | Admitting: Certified Registered"

## 2018-05-01 ENCOUNTER — Other Ambulatory Visit: Payer: Self-pay

## 2018-05-01 ENCOUNTER — Ambulatory Visit: Payer: 59

## 2018-05-01 ENCOUNTER — Encounter: Payer: Self-pay | Admitting: *Deleted

## 2018-05-01 DIAGNOSIS — Z885 Allergy status to narcotic agent status: Secondary | ICD-10-CM | POA: Insufficient documentation

## 2018-05-01 DIAGNOSIS — E669 Obesity, unspecified: Secondary | ICD-10-CM | POA: Insufficient documentation

## 2018-05-01 DIAGNOSIS — O021 Missed abortion: Secondary | ICD-10-CM | POA: Insufficient documentation

## 2018-05-01 DIAGNOSIS — O3680X0 Pregnancy with inconclusive fetal viability, not applicable or unspecified: Secondary | ICD-10-CM

## 2018-05-01 DIAGNOSIS — N96 Recurrent pregnancy loss: Secondary | ICD-10-CM | POA: Insufficient documentation

## 2018-05-01 HISTORY — DX: Acquired absence of other specified parts of digestive tract: Z90.49

## 2018-05-01 HISTORY — PX: DILATION AND EVACUATION: SHX1459

## 2018-05-01 LAB — CBC
HCT: 41.2 % (ref 36.0–46.0)
HEMOGLOBIN: 13.5 g/dL (ref 12.0–15.0)
MCH: 30.7 pg (ref 26.0–34.0)
MCHC: 32.8 g/dL (ref 30.0–36.0)
MCV: 93.6 fL (ref 80.0–100.0)
PLATELETS: 294 10*3/uL (ref 150–400)
RBC: 4.4 MIL/uL (ref 3.87–5.11)
RDW: 12 % (ref 11.5–15.5)
WBC: 7.3 10*3/uL (ref 4.0–10.5)
nRBC: 0 % (ref 0.0–0.2)

## 2018-05-01 LAB — BASIC METABOLIC PANEL
Anion gap: 7 (ref 5–15)
BUN: 12 mg/dL (ref 6–20)
CHLORIDE: 106 mmol/L (ref 98–111)
CO2: 27 mmol/L (ref 22–32)
CREATININE: 0.9 mg/dL (ref 0.44–1.00)
Calcium: 9.1 mg/dL (ref 8.9–10.3)
GFR calc Af Amer: >60 mL/min (ref >60)
GFR calc non Af Amer: >60 mL/min (ref >60)
Glucose, Bld: 103 mg/dL — ABNORMAL HIGH (ref 70–99)
Potassium: 4.4 mmol/L (ref 3.5–5.1)
SODIUM: 140 mmol/L (ref 135–145)

## 2018-05-01 LAB — ABO/RH: ABO/RH(D): A POS

## 2018-05-01 LAB — TYPE AND SCREEN
ABO/RH(D): A POS
ANTIBODY SCREEN: NEGATIVE

## 2018-05-01 SURGERY — DILATION AND EVACUATION, UTERUS
Anesthesia: General

## 2018-05-01 MED ORDER — FAMOTIDINE 20 MG PO TABS
ORAL_TABLET | ORAL | Status: AC
  Administered 2018-05-01: 20 mg
  Filled 2018-05-01: qty 1

## 2018-05-01 MED ORDER — MIDAZOLAM HCL 2 MG/2ML IJ SOLN
INTRAMUSCULAR | Status: DC | PRN
  Administered 2018-05-01: 2 mg via INTRAVENOUS

## 2018-05-01 MED ORDER — KETOROLAC TROMETHAMINE 30 MG/ML IJ SOLN
INTRAMUSCULAR | Status: DC | PRN
  Administered 2018-05-01: 30 mg via INTRAVENOUS

## 2018-05-01 MED ORDER — PROPOFOL 10 MG/ML IV BOLUS
INTRAVENOUS | Status: DC | PRN
  Administered 2018-05-01: 200 mg via INTRAVENOUS

## 2018-05-01 MED ORDER — LACTATED RINGERS IV SOLN
INTRAVENOUS | Status: DC
  Administered 2018-05-01: 09:00:00 via INTRAVENOUS

## 2018-05-01 MED ORDER — SODIUM CHLORIDE FLUSH 0.9 % IV SOLN
INTRAVENOUS | Status: AC
  Filled 2018-05-01: qty 10

## 2018-05-01 MED ORDER — FENTANYL CITRATE (PF) 100 MCG/2ML IJ SOLN
INTRAMUSCULAR | Status: DC | PRN
  Administered 2018-05-01 (×2): 50 ug via INTRAVENOUS

## 2018-05-01 MED ORDER — FENTANYL CITRATE (PF) 100 MCG/2ML IJ SOLN: 25.0000 ug | INTRAMUSCULAR | Status: DC | PRN

## 2018-05-01 MED ORDER — PROPOFOL 10 MG/ML IV BOLUS
INTRAVENOUS | Status: AC
  Filled 2018-05-01: qty 20

## 2018-05-01 MED ORDER — FENTANYL CITRATE (PF) 100 MCG/2ML IJ SOLN
INTRAMUSCULAR | Status: AC
  Filled 2018-05-01: qty 2

## 2018-05-01 MED ORDER — OXYCODONE HCL 5 MG/5ML PO SOLN: 5.0000 mg | Freq: Once | ORAL | Status: DC | PRN

## 2018-05-01 MED ORDER — SEVOFLURANE IN SOLN
RESPIRATORY_TRACT | Status: AC
  Filled 2018-05-01: qty 250

## 2018-05-01 MED ORDER — LIDOCAINE HCL (CARDIAC) PF 100 MG/5ML IV SOSY
PREFILLED_SYRINGE | INTRAVENOUS | Status: DC | PRN
  Administered 2018-05-01: 100 mg via INTRAVENOUS

## 2018-05-01 MED ORDER — DOXYCYCLINE HYCLATE 100 MG PO TABS
200.0000 mg | ORAL_TABLET | Freq: Once | ORAL | Status: AC
  Administered 2018-05-01: 200 mg via ORAL
  Filled 2018-05-01 (×2): qty 2

## 2018-05-01 MED ORDER — LIDOCAINE HCL (PF) 2 % IJ SOLN
INTRAMUSCULAR | Status: AC
  Filled 2018-05-01: qty 10

## 2018-05-01 MED ORDER — MIDAZOLAM HCL 2 MG/2ML IJ SOLN
INTRAMUSCULAR | Status: AC
  Filled 2018-05-01: qty 2

## 2018-05-01 MED ORDER — SILVER NITRATE-POT NITRATE 75-25 % EX MISC
CUTANEOUS | Status: AC
  Filled 2018-05-01: qty 1

## 2018-05-01 MED ORDER — DOXYCYCLINE HYCLATE 100 MG PO TABS
100.0000 mg | ORAL_TABLET | Freq: Once | ORAL | Status: AC
  Administered 2018-05-01: 100 mg via ORAL
  Filled 2018-05-01: qty 1

## 2018-05-01 MED ORDER — MEPERIDINE HCL 50 MG/ML IJ SOLN: 6.2500 mg | INTRAMUSCULAR | Status: DC | PRN

## 2018-05-01 MED ORDER — OXYCODONE HCL 5 MG PO TABS: 5.0000 mg | ORAL_TABLET | Freq: Once | ORAL | Status: DC | PRN

## 2018-05-01 MED ORDER — PROMETHAZINE HCL 25 MG/ML IJ SOLN: 6.2500 mg | INTRAMUSCULAR | Status: DC | PRN

## 2018-05-01 MED ORDER — PHENYLEPHRINE HCL 10 MG/ML IJ SOLN
INTRAMUSCULAR | Status: DC | PRN
  Administered 2018-05-01 (×4): 100 ug via INTRAVENOUS

## 2018-05-01 MED ORDER — METHYLERGONOVINE MALEATE 0.2 MG/ML IJ SOLN
INTRAMUSCULAR | Status: AC
  Filled 2018-05-01: qty 1

## 2018-05-01 MED ORDER — DEXAMETHASONE SODIUM PHOSPHATE 10 MG/ML IJ SOLN
INTRAMUSCULAR | Status: DC | PRN
  Administered 2018-05-01: 8 mg via INTRAVENOUS

## 2018-05-01 MED ORDER — ONDANSETRON HCL 4 MG/2ML IJ SOLN
INTRAMUSCULAR | Status: DC | PRN
  Administered 2018-05-01: 4 mg via INTRAVENOUS

## 2018-05-01 SURGICAL SUPPLY — 21 items
CATH ROBINSON RED A/P 16FR (CATHETERS) ×3 IMPLANT
COVER WAND RF STERILE (DRAPES) ×3 IMPLANT
FILTER UTR ASPR SPEC (MISCELLANEOUS) ×1 IMPLANT
FLTR UTR ASPR SPEC (MISCELLANEOUS) ×3
GLOVE BIO SURGEON STRL SZ7 (GLOVE) ×3 IMPLANT
GLOVE INDICATOR 7.5 STRL GRN (GLOVE) ×3 IMPLANT
GOWN STRL REUS W/ TWL LRG LVL3 (GOWN DISPOSABLE) ×2 IMPLANT
GOWN STRL REUS W/TWL LRG LVL3 (GOWN DISPOSABLE) ×4
KIT BERKELEY 1ST TRIMESTER 3/8 (MISCELLANEOUS) ×3 IMPLANT
KIT TURNOVER CYSTO (KITS) ×3 IMPLANT
PACK DNC HYST (MISCELLANEOUS) ×3 IMPLANT
PAD OB MATERNITY 4.3X12.25 (PERSONAL CARE ITEMS) ×3 IMPLANT
PAD PREP 24X41 OB/GYN DISP (PERSONAL CARE ITEMS) ×3 IMPLANT
SET BERKELEY SUCTION TUBING (SUCTIONS) ×3 IMPLANT
TOWEL OR 17X26 4PK STRL BLUE (TOWEL DISPOSABLE) ×3 IMPLANT
VACURETTE 10 RIGID CVD (CANNULA) IMPLANT
VACURETTE 6 ASPIR F TIP BERK (CANNULA) IMPLANT
VACURETTE 7MM F TIP (CANNULA)
VACURETTE 7MM F TIP STRL (CANNULA) IMPLANT
VACURETTE 8 RIGID CVD (CANNULA) IMPLANT
VACURETTE 8MM F TIP (MISCELLANEOUS) ×3 IMPLANT

## 2018-05-01 NOTE — Anesthesia Post-op Follow-up Note (Signed)
Anesthesia QCDR form completed.        

## 2018-05-01 NOTE — Interval H&P Note (Signed)
History and Physical Interval Note:  05/01/2018 9:17 AM  Rebecca Owens  has presented today for surgery, with the diagnosis of missed abortion  The various methods of treatment have been discussed with the patient and family. After consideration of risks, benefits and other options for treatment, the patient has consented to  Procedure(s): DILATATION AND EVACUATION (N/A) as a surgical intervention. She began spotting last night. We will get a confirmation ultrasound through the hospital prior to surgery.  The patient's history has been reviewed, patient examined, no change in status, stable for surgery.  I have reviewed the patient's chart and labs.  Questions were answered to the patient's satisfaction.     Christeen Douglas

## 2018-05-01 NOTE — Transfer of Care (Signed)
Immediate Anesthesia Transfer of Care Note  Patient: Rebecca Owens [facility]  Procedure(s) Performed: DILATATION AND EVACUATION (N/A )  Patient Location: PACU  Anesthesia Type:General  Level of Consciousness: drowsy and responds to stimulation  Airway & Oxygen Therapy: Patient Spontanous Breathing and Patient connected to face mask oxygen  Post-op Assessment: Report given to RN and Post -op Vital signs reviewed and stable  Post vital signs: Reviewed and stable  Last Vitals:  Vitals Value Taken Time  BP 96/58 05/01/2018 11:49 AM  Temp 37.2 C 05/01/2018 11:49 AM  Pulse 83 05/01/2018 11:49 AM  Resp 16 05/01/2018 11:49 AM  SpO2 99 % 05/01/2018 11:49 AM  Vitals shown include unvalidated device data.  Last Pain:  Vitals:   05/01/18 0806  TempSrc: Oral  PainSc: 3          Complications: No apparent anesthesia complications

## 2018-05-01 NOTE — Discharge Instructions (Signed)
Discharge instructions after dilation and curettage  Signs and Symptoms to Report  Call our office at [phone] if you have any of the following:   . Fever over 100.4 degrees or higher . Severe stomach pain not relieved with pain medications . Bright red bleeding that's heavier than a period that does not slow with rest after the first 24 hours . To go the bathroom a lot (frequency), you can't hold your urine (urgency), or it hurts when you empty your bladder (urinate) . Chest pain . Shortness of breath . Pain in the calves of your legs . Severe nausea and vomiting not relieved with anti-nausea medications . Any concerns  What You Can Expect after Surgery . You may see some pink tinged, bloody fluid. This is normal. You may also have cramping for several days.   Activities after Your Discharge Follow these guidelines to help speed your recovery at home: . Don't drive if you are in pain or taking narcotic pain medicine. You may drive when you can safely slam on the brakes, turn the wheel forcefully, and rotate your torso comfortably. This is typically 4-7 days. Practice in a parking lot or side street prior to attempting to drive regularly.  . Ask others to help with household chores for 4 weeks. . Don't do strenuous activities, exercises, or sports like vacuuming, tennis, squash, etc. until your doctor says it is safe to do so. . Walk as you feel able. Rest often since it may take a week or two for your energy level to return to normal.  . You may climb stairs . Avoid constipation:   -Eat fruits, vegetables, and whole grains. Eat small meals as your appetite will take time to return to normal.   -Drink 6 to 8 glasses of water each day unless your doctor has told you to limit your fluids.   -Use a laxative or stool softener as needed if constipation becomes a problem. You may take Miralax, metamucil, Citrucil, Colace, Senekot, FiberCon, etc. If this does not relieve the constipation,  try two tablespoons of Milk Of Magnesia every 8 hours until your bowels move.  . You may shower.  . Do not get in a hot tub, swimming pool, etc. until your doctor agrees. . Do not douche, use tampons, or have sex until your doctor says it is okay, usually about 2 weeks. . Take your pain medicine when you need it. The medicine may not work as well if the pain is bad.  Take the medicines you were taking before surgery. Other medications you might need are pain medications (ibuprofen), medications for constipation (Colace) and nausea medications (Zofran).    AMBULATORY SURGERY  DISCHARGE INSTRUCTIONS   1) The drugs that you were given will stay in your system until tomorrow so for the next 24 hours you should not:  A) Drive an automobile B) Make any legal decisions C) Drink any alcoholic beverage   2) You may resume regular meals tomorrow.  Today it is better to start with liquids and gradually work up to solid foods.  You may eat anything you prefer, but it is better to start with liquids, then soup and crackers, and gradually work up to solid foods.   3) Please notify your doctor immediately if you have any unusual bleeding, trouble breathing, redness and pain at the surgery site, drainage, fever, or pain not relieved by medication.    4) Additional Instructions: TAKE A STOOL SOFTENER TWICE A DAY WHILE TAKING  NARCOTIC PAIN MEDICINE TO PREVENT CONSTIPATION   Please contact your physician with any problems or Same Day Surgery at [phone], Monday through Friday 6 am to 4 pm, or [place] at [facility] number at [phone].

## 2018-05-01 NOTE — H&P (Signed)
Rebecca Owens is a 32 y.o. female here for Follow-up (u/s follow up) . 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 Labs were normal except for elevated Testosterone.  APLS was normal  Prior u/s: - 10/15 with gestational sac of 8mm without a yolk sac, without fetal pole seen.  10/28 with gestational sac of 14.74mm with a yolk sac visualized but no fetal pole, which is 13 days from last ultrasound, suggesting non-viable pregnancy.  - 11/4 with gestational sac of 19.58mm with yolk sac without fetal pole, which is 20 days from first gestational sac sighting, which confirms early pregnancy loss.   Past Medical History:  has a past medical history of IUD contraception (06/23/2012) and Obesity.  Past Surgical History:  has a past surgical history that includes Cholecystectomy (2009). Family History: family history includes Alzheimer's disease in her paternal grandfather; Coronary Artery Disease (Blocked arteries around heart) in her paternal grandfather; High blood pressure (Hypertension) in her father. Social History:  reports that she has never smoked. She has never used smokeless tobacco. She reports current alcohol use. She reports that she does not use drugs. OB/GYN History:          OB History    Gravida  8   Para  1   Term  1   Preterm      AB  6   Living  1     SAB  4   TAB  2   Ectopic      Molar      Multiple      Live Births  1          Allergies: is allergic to codeine. Medications:  Current Outpatient Medications:  .  benzonatate (TESSALON) 200 MG capsule, Take 1 capsule (200 mg total) by mouth 3 (three) times daily as needed for Cough (Patient not taking: Reported on 12/03/2017 ), Disp: 30 capsule, Rfl: 0 .  BIOTIN ORAL, Take 1 tablet by mouth once daily, Disp: , Rfl:  .  cetirizine (ZYRTEC) 10 MG tablet, Take 10 mg by mouth once daily, Disp: , Rfl:  .  ibuprofen (ADVIL,MOTRIN) 600 MG tablet, Take 600 mg by mouth  every 6 (six) hours as needed for Pain, Disp: , Rfl:  .  multivit,calc,mins/iron/folic (ONE-A-DAY WOMENS FORMULA ORAL), Take 1 tablet by mouth once daily, Disp: , Rfl:  .  progesterone (PROMETRIUM) 200 MG capsule, Insert one capsule vaginally nightly, Disp: 30 capsule, Rfl: 1  Review of Systems: No SOB, no palpitations or chest pain, no new lower extremity edema, no nausea or vomiting or bowel or bladder complaints. See HPI for gyn specific ROS.    Exam:      Vitals:   04/29/18 1439  BP: 110/76   Body mass index is 44.1 kg/m.  General: Well-developed, well-nourished female in no acute distress Body mass index is 44.1 kg/m. Skin: No rashes, ulcers or skin lesions noted. No excessive hirsutism or acne noted.  Neurological: Appears alert and oriented and is a good historian. No gross abnormalities are noted. Psychological: Normal affect and mood. No signs of anxiety or depression noted.  Pelvic exam: Deferred    Impression:   The encounter diagnosis was Missed abortion.    Plan:    Surgical management: Suction D&C, with collection of the products of conception. Risks of surgery were discussed with the patient including but not limited to: bleeding which may require transfusion; infection which may require antibiotics; injury to uterus or surrounding organs;  intrauterine scarring which may impair future fertility; need for additional procedures including laparotomy or laparoscopy; and other postoperative/anesthesia complications. Written informed consent was obtained.  We will send off the products of conception for chromosome analysis to see if we can determine a genetic cause for her recurrent miscarriage.  This is a scheduled same-day surgery. She will have a postop visit in 2 weeks to review operative findings and pathology.  Return for Postop check.  Cecilie Kicks, MD

## 2018-05-01 NOTE — Anesthesia Postprocedure Evaluation (Signed)
Anesthesia Post Note  Patient: Rebecca Owens [facility]  Procedure(s) Performed: DILATATION AND EVACUATION (N/A )  Patient location during evaluation: PACU Anesthesia Type: General Level of consciousness: awake and alert and oriented Pain management: pain level controlled Vital Signs Assessment: post-procedure vital signs reviewed and stable Respiratory status: spontaneous breathing, nonlabored ventilation and respiratory function stable Cardiovascular status: blood pressure returned to baseline and stable Postop Assessment: no signs of nausea or vomiting Anesthetic complications: no     Last Vitals:  Vitals:   05/01/18 1219 05/01/18 1239  BP: 106/62 (!) 101/59  Pulse: 76 68  Resp: 16 18  Temp: 37.3 C 36.8 C  SpO2: 100% 98%    Last Pain:  Vitals:   05/01/18 1239  TempSrc: Oral  PainSc: 0-No pain                 Jeziel Hoffmann

## 2018-05-01 NOTE — Anesthesia Procedure Notes (Signed)
Procedure Name: LMA Insertion Performed by: Lenah Messenger, CRNA Pre-anesthesia Checklist: Patient identified, Patient being monitored, Timeout performed, Emergency Drugs available and Suction available Patient Re-evaluated:Patient Re-evaluated prior to induction Oxygen Delivery Method: Circle system utilized Preoxygenation: Pre-oxygenation with 100% oxygen Induction Type: IV induction Ventilation: Mask ventilation without difficulty LMA: LMA inserted LMA Size: 3.5 Tube type: Oral Number of attempts: 1 Placement Confirmation: positive ETCO2 and breath sounds checked- equal and bilateral Tube secured with: Tape Dental Injury: Teeth and Oropharynx as per pre-operative assessment        

## 2018-05-01 NOTE — Op Note (Signed)
Operative Report Suction Dilation and Curettage   Indications: vaginal bleeding, not viable pregnancy   Pre-operative Diagnosis: Missed abortion at 6 weeks 6 days by ultrasound, 10 wks 3 day by LMP, with a hx of recurrent miscarriage with same partner  Post-operative Diagnosis: same.  Procedure: 1. Suction D&C, with collection of products for SNP microarray  Surgeon: Christeen Douglas, MD  Assistant(s):  None  Anesthesia: General LMA anesthesia  Anesthesiologist: No responsible provider has been recorded for the case. Anesthesiologist: Alver Fisher, MD CRNA: Casey Burkitt, CRNA  Estimated Blood Loss:  Minimal         Intraoperative medications: toradol         Total IV Fluids:  Urine Output: 50ml         Specimens: products of conception to pathology, and also to [facility] for SNP microarray         Complications:  None; patient tolerated the procedure well.         Disposition: PACU - hemodynamically stable.         Condition: stable  Findings: Uterus measuring 6 weeks; normal cervix, vagina, perineum.   Indication for procedure/Consents: 32 y.o.  [id number] with a hx of recurrent miscarriage here for scheduled surgery for the aforementioned diagnoses.  The goal was to capture products of conception for genetic testing, to further her desire for life birth pregnancy.  Risks of surgery were discussed with the patient including but not limited to: bleeding which may require transfusion; infection which may require antibiotics; injury to uterus or surrounding organs; intrauterine scarring which may impair future fertility; need for additional procedures including laparotomy or laparoscopy; and other postoperative/anesthesia complications. Written informed consent was obtained.    Procedure Details:   The patient received oral antibiotics while in the preoperative area.  She was then taken to the operating room where general anesthesia was administered and was found to be  adequate.  After a formal and adequate timeout was performed, she was placed in the dorsal lithotomy position and examined with the above findings. She was then prepped and draped in the sterile manner.   Her bladder was catheterized for an estimated amount of clear, yellow urine. A speculum was then placed in the patient's vagina and a single tooth tenaculum was applied to the anterior lip of the cervix.    No uterine sounding was performed on this pregnant uterus. Her cervix was serially dilated to accommodate a 6 sized flexible suction curette.  A sharp curettage was then performed until there was a gritty texture in all four quadrants.  The tenaculum was removed from the anterior lip of the cervix and the vaginal speculum was removed after noting good hemostasis. The patient tolerated the procedure well and was taken to the recovery area awake, extubated and in stable condition.  The patient will be discharged to home as per PACU criteria.  She will receive another dose of oral antibiotics prior to discharge. Routine postoperative instructions given.  She was prescribed Percocet, Ibuprofen and Colace.  She will follow up in the clinic in two weeks for postoperative evaluation.

## 2018-05-01 NOTE — Anesthesia Preprocedure Evaluation (Signed)
Anesthesia Evaluation  Patient identified by MRN, date of birth, ID band Patient awake    Reviewed: Allergy & Precautions, NPO status , Patient's Chart, lab work & pertinent test results  History of Anesthesia Complications Negative for: history of anesthetic complications  Airway Mallampati: II  TM Distance: >3 FB Neck ROM: Full    Dental no notable dental hx.    Pulmonary neg pulmonary ROS, neg sleep apnea, neg COPD,    breath sounds clear to auscultation- rhonchi (-) wheezing      Cardiovascular Exercise Tolerance: Good (-) hypertension(-) CAD and (-) Past MI  Rhythm:Regular Rate:Normal - Systolic murmurs and - Diastolic murmurs    Neuro/Psych negative neurological ROS  negative psych ROS   GI/Hepatic negative GI ROS, Neg liver ROS,   Endo/Other  negative endocrine ROSneg diabetes  Renal/GU negative Renal ROS     Musculoskeletal negative musculoskeletal ROS (+)   Abdominal (+) + obese,   Peds  Hematology negative hematology ROS (+)   Anesthesia Other Findings   Reproductive/Obstetrics (+) Pregnancy (missed abortion)                             Anesthesia Physical Anesthesia Plan  ASA: II  Anesthesia Plan: General   Post-op Pain Management:    Induction: Intravenous  PONV Risk Score and Plan: 2 and Ondansetron, Dexamethasone and Midazolam  Airway Management Planned: LMA  Additional Equipment:   Intra-op Plan:   Post-operative Plan:   Informed Consent: I have reviewed the patients History and Physical, chart, labs and discussed the procedure including the risks, benefits and alternatives for the proposed anesthesia with the patient or authorized representative who has indicated his/her understanding and acceptance.   Dental advisory given  Plan Discussed with: CRNA and Anesthesiologist  Anesthesia Plan Comments:         Anesthesia Quick Evaluation

## 2018-05-02 ENCOUNTER — Encounter: Payer: Self-pay | Admitting: Obstetrics and Gynecology

## 2018-05-04 LAB — SURGICAL PATHOLOGY

## 2018-07-02 ENCOUNTER — Other Ambulatory Visit: Payer: Self-pay

## 2018-07-06 ENCOUNTER — Ambulatory Visit
Admission: RE | Admit: 2018-07-06 | Discharge: 2018-07-06 | Disposition: A | Discharge status: Home / Self Care | Payer: Managed Care, Other (non HMO) | Source: Ambulatory Visit | Attending: Obstetrics and Gynecology | Admitting: Obstetrics and Gynecology

## 2018-07-06 VITALS — BP 134/73 | HR 86 | Temp 97.7°F | Resp 18 | Ht 65.0 in | Wt 260.0 lb

## 2018-07-06 DIAGNOSIS — N96 Recurrent pregnancy loss: Secondary | ICD-10-CM | POA: Insufficient documentation

## 2018-07-06 DIAGNOSIS — Q999 Chromosomal abnormality, unspecified: Secondary | ICD-10-CM | POA: Insufficient documentation

## 2018-07-06 DIAGNOSIS — E669 Obesity, unspecified: Secondary | ICD-10-CM | POA: Diagnosis present

## 2018-07-06 DIAGNOSIS — E6609 Other obesity due to excess calories: Secondary | ICD-10-CM

## 2018-07-06 DIAGNOSIS — O09299 Supervision of pregnancy with other poor reproductive or obstetric history, unspecified trimester: Secondary | ICD-10-CM

## 2018-07-06 DIAGNOSIS — Z6830 Body mass index (BMI) 30.0-30.9, adult: Secondary | ICD-10-CM

## 2018-07-06 DIAGNOSIS — O200 Threatened abortion: Secondary | ICD-10-CM | POA: Diagnosis present

## 2018-07-06 NOTE — Progress Notes (Signed)
[facility]  [facility]  [name] was [facility] due to the finding of an abnormal chromosome complement in a recent pregnancy loss.  Additional testing at this visit showed a chromosome translocation present in her partner, Rebecca Owens, as delineated below.    We first obtained a detailed pregnancy history and family history.  [name] and her husband have a healthy 32 year old daughter.  The couple has also experienced 7 first trimester losses at 6-[redacted] weeks gestation and 1 elective termination many years ago for personal reasons.  The most recent loss required a D&E which allowed for chromosomal microarray testing on the tissue.  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  In the remainder of the family history, there are no other persons with recurrent pregnancy loss. [name] reported one maternal cousin who passed away in her 30s with seizures and developmental delay of unknown cause.  Her paternal grandmother had one stillborn baby and 7 healthy children.  Rebecca Owens reported one paternal first cousin with physical and developmental disabilities due to possible birth trauma.  No other details as to the cause for any other these conditions is known.  Given the abnormal results on the recent loss, we ordered chromosome analysis on both Rebecca Owens and Rebecca Owens.  Her karyotype was normal, 46,XX.  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  To understand the significance of a chromosome translocation, we reviewed basic information about chromosomes.  Our chromosomes contain the genetic information that directs the growth and development of our bodies.  Typically, there are two copies of each of 23 chromosomes in each cell of the human body.   This results in 46 chromosomes, with two copies of all of the genetic information.  One copy is from the mother, and one from the father.  Differences in the number or the structure of the chromosomes can cause problems in the way the body develops.  Changes in the number or structure of chromosomes often result in miscarriage, birth defects and/or mental retardation.  Sometimes, however, there can be a change in the structure of the chromosomes without a change in the amount of genetic information.  One such change is called a translocation.  In a translocation, two different chromosomes break and then exchange material.  If there is no loss or gain of genetic information and the exchange does not interrupt an important gene, we say that the translocation is "balanced" and would not expect any problems in growth or development as a result of this change.  Balanced translocations may be passed down through families without causing any problems such as birth defects or intellectual disabilities.  However, even with balanced translocations, difficulties may arise when the translocated chromosomes separate into egg or sperm cells.  Typically, the two copies of each chromosome pair up and then separate, with one of each type going into one egg or sperm cell.  Because these translocated chromosomes are not able to pair up and separate in the usual way, an egg or sperm cell with either more or less genetic information can result.  For this reason, persons with translocations may experience multiple miscarriages or the birth of children with birth defects or mental retardation due to an unbalanced amount of genetic information being present in the child.  The specific translocation seen  in Rebecca Owens involves chromosomes number 8 and 11.  There is a break in the long arm of chromosome 8 at location q24.1.  All genetic information from there to the end of the chromosome is then attached to the long arm of chromosome 11.  The  breakpoint on chromosome 11 is at location q23.3, and the end of that chromosome is attached to chromosome 8.  Because Rebecca Owens is healthy and has no learning problems or birth defects, we expect that this is a balanced rearrangement.  We reviewed how these chromosomes would attempt to pair up to make sperm cells and what the possible combinations for unbalanced gametes would be.  With these different possibilities, the amount of imbalance would likely be an extra copy of the material on chromosome 8q and a missing copy of the material on chromosome 11q OR a missing copy of the material on chromosome 8q and an extra copy of the material on chromosome 11q.  The other possibility is that the fetus could inherit the balanced rearrangement or the normal copies of each of the chromosomes, resulting in a normal outcome for the pregnancy.  Studies have shown that the chance for a person with a translocation to have a liveborn child with an unbalanced amount of genetic material varies depending on what chromosomes are involved and the size of the rearrangement. These estimates are made more difficult because most translocations are specific to only one family, so there is often not accurate data on any specific translocation to provide risk assessments.  There is a clinical genetics laboratory which will provide risk estimates based upon data from the family history as well as other known families with various translocations.  The best estimate for Rebecca Owens would be a 42% chance for miscarriage in each recognized pregnancy (this includes the baseline risk of approximately 20%).  The chance that a liveborn child would have an unbalanced translocation is 1.5-4%.  Conversely, for a pregnancy that results in a livebirth, we would estimate a >95% chance for a child with either normal chromosomes or the balanced chromosome complement like Rebecca Owens.  When a chromosome difference is identified in a parent, the option of  prenatal diagnosis is made available.  Either chorionic villus sampling (CVS) or amniocentesis may be used to detect chromosome conditions in a fetus.  CVS is performed at 10 to [redacted] weeks gestation and has a risk of miscarriage of about 1 in 100.  This procedure involves obtaining a sample of the developing placenta for chromosome studies.  Amniocentesis is performed after [redacted] weeks gestation and has a risk of 1 in 200.  By sampling the amniotic fluid, we can examine the chromosomes in skin cells of the fetus which are floating in the fluid.  We would also suggest chromosome analysis on any future miscarriages to determine if the loss is likely due to a chromosomal imbalance in the fetus.  Because chromosome imbalances may result in physical birth defects, a level 2 ultrasound would also be recommended to evaluate the fetal anatomy at approximately [redacted] weeks gestation.    Another possibility we reviewed is that some couples with a known risk for chromosome conditions in pregnancy consider alternative reproductive options including donor sperm or donor egg, IVF with preimplantation genetic diagnosis, or adoption.  At the time we discussed these results and estimates, the patient indicated that they would like to continue to try to conceive naturally this year.  When her insurance can be changed in the coming  year, she may consider an appointment with Reproductive Endocrinology to discuss IVF/PGD options.  Lastly, we would offer routine screening and testing options for any future pregnancy including first trimester screening, maternal serum marker screening, targeted ultrasound and carrier testing for recessive genetic conditions.  These cannot help assess the risks for chromosome imbalance related to this translocation, but are a routine means of screening for chromosome conditions or birth defects in any pregnancy.  Of note, every pregnancy has a baseline risk of about 3-4% for having a birth defect and a risk  for miscarriage of 10 to 20%.  No current testing is available to eliminate that risk.  We do, however, recommend that any woman of childbearing age take vitamins containing 0.4 mg of  folate at least one month prior to conception and during pregnancy.  Folate has been shown to reduce the risk of open neural tube defects in the children of women who are taking the vitamin.   Thank you for allowing [place] to be involved in the care of this family.  Ms. Owens was encouraged to contact us with any additional questions or concerns or to review testing options in any future pregnancy at [phone].   Cherly Anderson, MS, CGC

## 2018-07-15 LAB — MISC LABCORP TEST (SEND OUT): Labcorp test code: 511035

## 2018-07-16 ENCOUNTER — Telehealth: Payer: Self-pay | Admitting: Obstetrics and Gynecology

## 2018-07-16 NOTE — Telephone Encounter (Signed)
I spoke with Rebecca Owens about the results of recent chromosome analysis on she and her partner, Rebecca Owens (dob [birth year]) following the abnormal chromosome results from their recent miscarriage.  Rebecca Owens had normal female chromosome results (46,XX). 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  This is expected to be the cause for the recurrent pregnancy losses the couple has experienced.  We have reached out and are awaiting contact from an expert who maintains a database of chromosome translocations and can provide risk estimates for miscarriage, liveborn children with chromosome imbalance, and liveborn children with balanced or normal outcomes.  Once that information is available, I will recontact the patient to review and complete the full genetic counseling note from her visit on 07/06/2018.  We may be reached at [phone] with questions or concerns.  Cherly Anderson, MS, CGC

## 2018-10-08 DIAGNOSIS — M546 Pain in thoracic spine: Secondary | ICD-10-CM | POA: Insufficient documentation

## 2019-07-09 DIAGNOSIS — N96 Recurrent pregnancy loss: Secondary | ICD-10-CM

## 2019-07-09 DIAGNOSIS — Z8489 Family history of other specified conditions: Secondary | ICD-10-CM

## 2019-07-09 HISTORY — DX: Family history of other specified conditions: Z84.89

## 2019-07-09 HISTORY — DX: Recurrent pregnancy loss: N96

## 2019-09-17 ENCOUNTER — Other Ambulatory Visit: Payer: Self-pay

## 2019-09-17 DIAGNOSIS — Z79899 Other long term (current) drug therapy: Secondary | ICD-10-CM | POA: Diagnosis not present

## 2019-09-17 DIAGNOSIS — N201 Calculus of ureter: Secondary | ICD-10-CM | POA: Insufficient documentation

## 2019-09-17 DIAGNOSIS — N200 Calculus of kidney: Secondary | ICD-10-CM | POA: Diagnosis not present

## 2019-09-17 DIAGNOSIS — R1032 Left lower quadrant pain: Secondary | ICD-10-CM | POA: Diagnosis present

## 2019-09-17 LAB — CBC
HCT: 39.8 % (ref 36.0–46.0)
Hemoglobin: 13.5 g/dL (ref 12.0–15.0)
MCH: 30.7 pg (ref 26.0–34.0)
MCHC: 33.9 g/dL (ref 30.0–36.0)
MCV: 90.5 fL (ref 80.0–100.0)
Platelets: 281 10*3/uL (ref 150–400)
RBC: 4.4 MIL/uL (ref 3.87–5.11)
RDW: 11.7 % (ref 11.5–15.5)
WBC: 12.1 10*3/uL — ABNORMAL HIGH (ref 4.0–10.5)
nRBC: 0 % (ref 0.0–0.2)

## 2019-09-17 LAB — COMPREHENSIVE METABOLIC PANEL
ALT: 30 U/L (ref 0–44)
AST: 34 U/L (ref 15–41)
Albumin: 4.3 g/dL (ref 3.5–5.0)
Alkaline Phosphatase: 82 U/L (ref 38–126)
Anion gap: 13 (ref 5–15)
BUN: 17 mg/dL (ref 6–20)
CO2: 20 mmol/L — ABNORMAL LOW (ref 22–32)
Calcium: 9 mg/dL (ref 8.9–10.3)
Chloride: 105 mmol/L (ref 98–111)
Creatinine, Ser: 0.97 mg/dL (ref 0.44–1.00)
GFR calc Af Amer: >60 mL/min (ref >60)
GFR calc non Af Amer: >60 mL/min (ref >60)
Glucose, Bld: 113 mg/dL — ABNORMAL HIGH (ref 70–99)
Potassium: 3.9 mmol/L (ref 3.5–5.1)
Sodium: 138 mmol/L (ref 135–145)
Total Bilirubin: 0.6 mg/dL (ref 0.3–1.2)
Total Protein: 7.3 g/dL (ref 6.5–8.1)

## 2019-09-17 LAB — LIPASE, BLOOD: Lipase: 31 U/L (ref 11–51)

## 2019-09-17 MED ORDER — FENTANYL CITRATE (PF) 100 MCG/2ML IJ SOLN
50.0000 ug | INTRAMUSCULAR | Status: DC | PRN
  Administered 2019-09-17: 23:00:00 50 ug via NASAL
  Filled 2019-09-17: qty 2

## 2019-09-17 MED ORDER — ONDANSETRON 4 MG PO TBDP
4.0000 mg | ORAL_TABLET | Freq: Once | ORAL | Status: AC | PRN
  Administered 2019-09-17: 23:00:00 4 mg via ORAL
  Filled 2019-09-17: qty 1

## 2019-09-17 NOTE — ED Triage Notes (Signed)
Patient c/o left lower abdominal pain radiating to back and pelvis. Patient reports accompanying symptoms of nausea. Patient dry heaving in triage.

## 2019-09-18 ENCOUNTER — Emergency Department: Payer: 59

## 2019-09-18 ENCOUNTER — Emergency Department
Admission: EM | Admit: 2019-09-18 | Discharge: 2019-09-18 | Disposition: A | Discharge status: Home / Self Care | Payer: 59 | Attending: Emergency Medicine | Admitting: Emergency Medicine

## 2019-09-18 DIAGNOSIS — N200 Calculus of kidney: Secondary | ICD-10-CM

## 2019-09-18 DIAGNOSIS — N201 Calculus of ureter: Secondary | ICD-10-CM

## 2019-09-18 LAB — URINALYSIS, COMPLETE (UACMP) WITH MICROSCOPIC
Bilirubin Urine: NEGATIVE
Glucose, UA: 50 mg/dL — AB
Ketones, ur: NEGATIVE mg/dL
Leukocytes,Ua: NEGATIVE
Nitrite: NEGATIVE
Protein, ur: 100 mg/dL — AB
RBC / HPF: >50 RBC/hpf — ABNORMAL HIGH (ref 0–5)
Specific Gravity, Urine: 1.024 (ref 1.005–1.030)
pH: 6 (ref 5.0–8.0)

## 2019-09-18 LAB — HCG, QUANTITATIVE, PREGNANCY: hCG, Beta Chain, Quant, S: 2 m[IU]/mL (ref <5)

## 2019-09-18 LAB — POCT PREGNANCY, URINE: Preg Test, Ur: NEGATIVE

## 2019-09-18 MED ORDER — SODIUM CHLORIDE 0.9 % IV BOLUS
500.0000 mL | Freq: Once | INTRAVENOUS | Status: AC
  Administered 2019-09-18: 01:00:00 500 mL via INTRAVENOUS

## 2019-09-18 MED ORDER — OXYCODONE-ACETAMINOPHEN 5-325 MG PO TABS: 2.0000 | ORAL_TABLET | Freq: Three times a day (TID) | ORAL | 0 refills | Status: DC | PRN

## 2019-09-18 MED ORDER — OXYCODONE-ACETAMINOPHEN 5-325 MG PO TABS
2.0000 | ORAL_TABLET | Freq: Once | ORAL | Status: AC
  Administered 2019-09-18: 2 via ORAL
  Filled 2019-09-18: qty 2

## 2019-09-18 MED ORDER — ONDANSETRON 4 MG PO TBDP: ORAL_TABLET | ORAL | 0 refills | Status: DC

## 2019-09-18 MED ORDER — MORPHINE SULFATE (PF) 4 MG/ML IV SOLN
4.0000 mg | Freq: Once | INTRAVENOUS | Status: AC
  Administered 2019-09-18: 01:00:00 4 mg via INTRAVENOUS
  Filled 2019-09-18: qty 1

## 2019-09-18 MED ORDER — ONDANSETRON HCL 4 MG/2ML IJ SOLN
4.0000 mg | INTRAMUSCULAR | Status: AC
  Administered 2019-09-18: 4 mg via INTRAVENOUS
  Filled 2019-09-18: qty 2

## 2019-09-18 MED ORDER — DOCUSATE SODIUM 100 MG PO CAPS: ORAL_CAPSULE | ORAL | 0 refills | Status: DC

## 2019-09-18 MED ORDER — TAMSULOSIN HCL 0.4 MG PO CAPS: ORAL_CAPSULE | ORAL | 0 refills | Status: DC

## 2019-09-18 MED ORDER — KETOROLAC TROMETHAMINE 30 MG/ML IJ SOLN
15.0000 mg | Freq: Once | INTRAMUSCULAR | Status: AC
  Administered 2019-09-18: 01:00:00 15 mg via INTRAVENOUS
  Filled 2019-09-18: qty 1

## 2019-09-18 NOTE — ED Notes (Signed)
Pt to CT att

## 2019-09-18 NOTE — ED Provider Notes (Signed)
[facility] Emergency Department Provider Note  ____________________________________________   First MD Initiated Contact with Patient 09/18/19 0034     (approximate)  I have reviewed the triage vital signs and the nursing notes.   HISTORY  Chief Complaint Abdominal Pain    HPI Rebecca Owens is a 34 y.o. female with medical history as listed below who presents for evaluation of acute onset sharp stabbing pain to the left lower quadrant of her abdomen that radiates up to the left flank.  It started acutely about 2-1/2 hours ago and has been persistent, waxing and waning, and accompanied with multiple episodes of vomiting and persistent nausea.  It feels much worse than when she had a small right-sided kidney stone 4 to 5 weeks ago.  She has no dysuria.  She denies fever, sore throat, chest pain, shortness of breath, and any other abdominal pain.  No history of trauma.         Past Medical History:  Diagnosis Date  . S/P cholecystectomy 01/2008    Patient Active Problem List   Diagnosis Date Noted  . 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 Obesity, unspecified 12/14/2015  . History of miscarriage, currently pregnant 03/02/2015  . Pregnancy with early threatened abortion 03/02/2015    Past Surgical History:  Procedure Laterality Date  . CHOLECYSTECTOMY    . DILATION AND EVACUATION N/A 05/01/2018   Procedure: DILATATION AND EVACUATION;  Surgeon: Christeen Douglas, MD;  Location: [facility] ORS;  Service: Gynecology;  Laterality: N/A;    Prior to Admission medications   Medication Sig Start Date End Date Taking? Authorizing Provider  docusate sodium (COLACE) 100 MG capsule Take 1 tablet once or twice daily as needed for constipation while taking narcotic pain medicine 09/18/19   Loleta Rose, MD  ondansetron (ZOFRAN ODT) 4 MG disintegrating tablet Allow 1-2 tablets to dissolve in your mouth every 8 hours as needed for nausea/vomiting 09/18/19    Loleta Rose, MD  oxyCODONE-acetaminophen (PERCOCET) 5-325 MG tablet Take 2 tablets by mouth every 8 (eight) hours as needed for severe pain. 09/18/19   Loleta Rose, MD  Prenatal Vit-Fe Fumarate-FA (PRENATAL MULTIVITAMIN) TABS tablet Take 1 tablet by mouth at bedtime.    [provider]  pyridOXINE (VITAMIN B-6) 100 MG tablet Take 100 mg by mouth at bedtime.    [provider]  tamsulosin (FLOMAX) 0.4 MG CAPS capsule Take 1 tablet by mouth daily until you pass the kidney stone or no longer have symptoms 09/18/19   Loleta Rose, MD    Allergies Codeine  Family History  Problem Relation Age of Onset  . Hyperlipidemia Father   . Heart disease Paternal Grandfather     Social History Social History   Tobacco Use  . Smoking status: Never Smoker  . Smokeless tobacco: Never Used  Substance Use Topics  . Alcohol use: Not Currently  . Drug use: No    Review of Systems Constitutional: No fever/chills Eyes: No visual changes. ENT: No sore throat. Cardiovascular: Denies chest pain. Respiratory: Denies shortness of breath. Gastrointestinal: Acute onset severe sharp left lower quadrant pain radiating to the left flank.  Multiple episodes of vomiting and persistent nausea. Genitourinary: Negative for dysuria.  No gross hematuria. Musculoskeletal: Left flank pain. Integumentary: Negative for rash. Neurological: Negative for headaches, focal weakness or numbness.   ____________________________________________   PHYSICAL EXAM:  VITAL SIGNS: ED Triage Vitals [09/17/19 2245]  Enc Vitals Group     BP (!) 113/55  Pulse Rate 93     Resp 18     Temp 98.4 F (36.9 C)     Temp src      SpO2 99 %     Weight 118.4 kg (261 lb)     Height 1.651 m (5\' 5" )     Head Circumference      Peak Flow      Pain Score 10     Pain Loc      Pain Edu?      Excl. in GC?     Constitutional: Alert and oriented.  Appears very uncomfortable and is pacing the room. Eyes:  Conjunctivae are normal.  Head: Atraumatic. Nose: No congestion/rhinnorhea. Mouth/Throat: Patient is wearing a mask. Neck: No stridor.  No meningeal signs.   Cardiovascular: Normal rate, regular rhythm. Good peripheral circulation. Grossly normal heart sounds. Respiratory: Normal respiratory effort.  No retractions. Gastrointestinal: Soft and nontender. No distention.  Musculoskeletal: No appreciable left CVA tenderness to percussion.  No lower extremity tenderness nor edema. No gross deformities of extremities. Neurologic:  Normal speech and language. No gross focal neurologic deficits are appreciated.  Skin:  Skin is warm, dry and intact. Psychiatric: Mood and affect are normal for someone who is acutely nauseated and in pain.  ____________________________________________   LABS (all labs ordered are listed, but only abnormal results are displayed)  Labs Reviewed  COMPREHENSIVE METABOLIC PANEL - Abnormal; Notable for the following components:      Result Value   CO2 20 (*)    Glucose, Bld 113 (*)    All other components within normal limits  CBC - Abnormal; Notable for the following components:   WBC 12.1 (*)    All other components within normal limits  URINALYSIS, COMPLETE (UACMP) WITH MICROSCOPIC - Abnormal; Notable for the following components:   Color, Urine YELLOW (*)    APPearance HAZY (*)    Glucose, UA 50 (*)    Hgb urine dipstick LARGE (*)    Protein, ur 100 (*)    RBC / HPF >50 (*)    Bacteria, UA RARE (*)    All other components within normal limits  LIPASE, BLOOD  HCG, QUANTITATIVE, PREGNANCY  POC URINE PREG, ED  POCT PREGNANCY, URINE   ____________________________________________  EKG  No indication for emergent EKG ____________________________________________  RADIOLOGY I, , personally viewed and evaluated these images (plain radiographs) as part of my medical decision making, as well as reviewing the written report by the  radiologist.  ED MD interpretation: 4 x 7 mm obstructive distal left ureteral stone with mild hydronephrosis.  Official radiology report(s): CT Renal Stone Study  Result Date: 09/18/2019 CLINICAL DATA:  Flank pain. Kidney stone suspected. Left lower abdominal pain radiating to the back and pelvis. Nausea. EXAM: CT ABDOMEN AND PELVIS WITHOUT CONTRAST TECHNIQUE: Multidetector CT imaging of the abdomen and pelvis was performed following the standard protocol without IV contrast. COMPARISON:  None. FINDINGS: Lower chest: Minor left greater than right lung base atelectasis. No pleural effusion. Hepatobiliary: Mild diffuse hepatic steatosis. No focal liver lesion. Clips in the gallbladder fossa postcholecystectomy. No biliary dilatation. Pancreas: No ductal dilatation or inflammation. Spleen: Normal in size without focal abnormality. Adrenals/Urinary Tract: Normal adrenal glands. Obstructing 4 x 7 mm stone in the distal left ureter just proximal to the ureterovesicular junction with mild hydroureteronephrosis and perinephric edema. There are 2 additional nonobstructing stones in the left kidney, largest measuring 6 mm. No right hydronephrosis. Possible punctate nonobstructing right renal stone.  The right ureter is decompressed. Urinary bladder is minimally distended. Stomach/Bowel: Nondistended stomach. No bowel obstruction or inflammation. Normal appendix. Small volume of colonic stool. No colonic inflammation. Vascular/Lymphatic: Abdominal aorta is normal in caliber. No bulky abdominopelvic adenopathy. Reproductive: Possible small anterior fundal fibroid, not well assessed in the absence of IV contrast. Ovaries are symmetric and normal in size. No suspicious adnexal mass. Other: No free air, free fluid, or intra-abdominal fluid collection. Small fat containing umbilical hernia. Musculoskeletal: There are no acute or suspicious osseous abnormalities. Bone island in the right pelvis. IMPRESSION: 1. Obstructing 4 x  7 mm stone in the distal left ureter just proximal to the ureterovesicular junction with mild hydronephrosis. 2. Additional nonobstructing stones in the left kidney. Possible punctate nonobstructing right renal stone. 3. Mild hepatic steatosis. Electronically Signed   By: Keith Rake M.D.   On: 09/18/2019 02:10    ____________________________________________   PROCEDURES   Procedure(s) performed (including Critical Care):  Procedures   ____________________________________________   INITIAL IMPRESSION / MDM / [place] / ED COURSE  As part of my medical decision making, I reviewed the following data within the [place] notes reviewed and incorporated, Labs reviewed , Old chart reviewed, Notes from prior ED visits and [place] Controlled Substance Database   Differential diagnosis includes, but is not limited to, renal/ureteral colic, UTI/pyelonephritis, musculoskeletal pain, less likely ovarian cyst or ovarian torsion.  Given the location of the pain I think it is by far most likely the patient is having ureteral colic.  She has no tenderness to palpation of her abdomen.  She is acutely uncomfortable and I will give morphine 4 mg IV, Toradol 15 mg IV, Zofran 4 mg IV, along with 500 mL normal saline IV bolus.  She said that it is possible she could be pregnant but she had a miscarriage about 4 weeks ago so she does not think it is likely.  I have ordered an hCG in case she cannot provide a urine specimen to check a point-of-care pregnancy test.  Lab work is pending and I anticipate obtaining a CT scan as well as a urinalysis when she is able to provide a specimen.      Clinical Course as of Sep 17 408  Sat Sep 18, 2019  0119 WNL for a negative pregnancy test, proceeding with CT renal stone protocol.  HCG, Beta Chain, Quant, S: 2 [CF]  0156 Hematuria without obvious infection  Urinalysis, Complete w Microscopic(!) [CF]  [id number] Preg Test, Ur: NEGATIVE  [CF]  0354 The patient feels much better and the pain is almost completely gone.  She has a large stone at the ureterovesicular juncture with mild hydronephrosis.  No sign of infection.  She is appropriate for outpatient management.  I had my usual and customary kidney/ureteral stone discussion with the patient including NSAID avoidance starting Tuesday morning in case she is a candidate for lithotripsy on Thursday.  I gave my usual return precautions and she understands and agrees with the plan.  Medications as listed below.   [CF]    Clinical Course User Index [CF] Hinda Kehr, MD     ____________________________________________  FINAL CLINICAL IMPRESSION(S) / ED DIAGNOSES  Final diagnoses:  Left ureteral stone  Kidney stones     MEDICATIONS GIVEN DURING THIS VISIT:  Medications  fentaNYL (SUBLIMAZE) injection 50 mcg (50 mcg Nasal Given 09/17/19 2251)  oxyCODONE-acetaminophen (PERCOCET/ROXICET) 5-325 MG per tablet 2 tablet (has no administration in time range)  ondansetron (  ZOFRAN-ODT) disintegrating tablet 4 mg (4 mg Oral Given 09/17/19 2248)  morphine 4 MG/ML injection 4 mg (4 mg Intravenous Given 09/18/19 0058)  ondansetron (ZOFRAN) injection 4 mg (4 mg Intravenous Given 09/18/19 0054)  ketorolac (TORADOL) 30 MG/ML injection 15 mg (15 mg Intravenous Given 09/18/19 0056)  sodium chloride 0.9 % bolus 500 mL (0 mLs Intravenous Stopped 09/18/19 0230)     ED Discharge Orders         Ordered    oxyCODONE-acetaminophen (PERCOCET) 5-325 MG tablet  Every 8 hours PRN     09/18/19 0408    ondansetron (ZOFRAN ODT) 4 MG disintegrating tablet     09/18/19 0408    tamsulosin (FLOMAX) 0.4 MG CAPS capsule     09/18/19 0408    docusate sodium (COLACE) 100 MG capsule     09/18/19 0408          *Please note:  Rebecca Owens was evaluated in Emergency Department on 09/18/2019 for the symptoms described in the history of present illness. She was evaluated in the context of the global COVID-19  pandemic, which necessitated consideration that the patient might be at risk for infection with the SARS-CoV-2 virus that causes COVID-19. Institutional protocols and algorithms that pertain to the evaluation of patients at risk for COVID-19 are in a state of rapid change based on information released by regulatory bodies including the CDC and federal and state organizations. These policies and algorithms were followed during the patient's care in the ED.  Some ED evaluations and interventions may be delayed as a result of limited staffing during the pandemic.*  Note:  This document was prepared using Dragon voice recognition software and may include unintentional dictation errors.   Loleta Rose, MD 09/18/19 [phone]

## 2019-09-18 NOTE — ED Notes (Signed)
Peripheral IV discontinued. Catheter intact. No signs of infiltration or redness. Gauze applied to IV site.   Discharge instructions reviewed with patient. Questions fielded by this RN. Patient verbalizes understanding of instructions. Patient discharged home in stable condition per [place]. No acute distress noted at time of discharge.    

## 2019-09-18 NOTE — Discharge Instructions (Addendum)
You have been seen in the Emergency Department (ED) today for pain caused by kidney stones.  As we have discussed, please drink plenty of fluids.  Please make a follow up appointment with the physician(s) listed elsewhere in this documentation.  You may take pain medication as needed but ONLY as prescribed.  Please also take your prescribed Flomax daily.  We also recommend that you take over-the-counter ibuprofen regularly according to label instructions.  Take it with meals to minimize stomach discomfort.  HOWEVER, if you are still having symptoms by Tuesday morning, do not take any ibuprofen, naproxen, or aspirin (medications called "NSAIDs").  You should call the office of the urologist and talk to them about your stone (4 mm x 7 mm in the left distal ureter) and ask them if you might be eligible for lithotripsy on Thursday.  You can call them on Monday to schedule a follow-up appointment and they can help you determine if this would be a good treatment for you.  Please see your doctor as soon as possible as stones may take 1-3 weeks to pass and you may require additional care or medications.  Do not drink alcohol, drive or participate in any other potentially dangerous activities while taking opiate pain medication as it may make you sleepy. Do not take this medication with any other sedating medications, either prescription or over-the-counter. If you were prescribed Percocet or Vicodin, do not take these with acetaminophen (Tylenol) as it is already contained within these medications.   Take Percocet as needed for severe pain.  This medication is an opiate (or narcotic) pain medication and can be habit forming.  Use it as little as possible to achieve adequate pain control.  Do not use or use it with extreme caution if you have a history of opiate abuse or dependence.  If you are on a pain contract with your primary care doctor or a pain specialist, be sure to let them know you were prescribed this  medication today from the [facility] Emergency Department.  This medication is intended for your use only - do not give any to anyone else and keep it in a secure place where nobody else, especially children, have access to it.  It will also cause or worsen constipation, so you may want to consider taking an over-the-counter stool softener while you are taking this medication.  Return to the Emergency Department (ED) or call your doctor if you have any worsening pain, fever, painful urination, are unable to urinate, or develop other symptoms that concern you.

## 2019-09-18 NOTE — ED Notes (Signed)
Pt retching in emesis bag  Family at bedside

## 2019-09-20 ENCOUNTER — Ambulatory Visit: Payer: Self-pay | Admitting: Urology

## 2019-10-11 ENCOUNTER — Other Ambulatory Visit: Payer: Self-pay | Admitting: Family Medicine

## 2019-10-11 DIAGNOSIS — N200 Calculus of kidney: Secondary | ICD-10-CM

## 2019-10-12 ENCOUNTER — Encounter: Payer: Self-pay | Admitting: Urology

## 2019-10-12 ENCOUNTER — Ambulatory Visit: Payer: 59 | Admitting: Urology

## 2019-10-12 ENCOUNTER — Other Ambulatory Visit: Payer: Self-pay

## 2019-10-12 ENCOUNTER — Other Ambulatory Visit
Admission: RE | Admit: 2019-10-12 | Discharge: 2019-10-12 | Disposition: A | Discharge status: Home / Self Care | Payer: 59 | Attending: Urology | Admitting: Urology

## 2019-10-12 VITALS — BP 115/82 | HR 86 | Ht 65.0 in | Wt 264.0 lb

## 2019-10-12 DIAGNOSIS — N939 Abnormal uterine and vaginal bleeding, unspecified: Secondary | ICD-10-CM | POA: Insufficient documentation

## 2019-10-12 DIAGNOSIS — N200 Calculus of kidney: Secondary | ICD-10-CM

## 2019-10-12 DIAGNOSIS — O3680X0 Pregnancy with inconclusive fetal viability, not applicable or unspecified: Secondary | ICD-10-CM | POA: Insufficient documentation

## 2019-10-12 DIAGNOSIS — N201 Calculus of ureter: Secondary | ICD-10-CM

## 2019-10-12 DIAGNOSIS — N912 Amenorrhea, unspecified: Secondary | ICD-10-CM | POA: Insufficient documentation

## 2019-10-12 HISTORY — DX: Abnormal uterine and vaginal bleeding, unspecified: N93.9

## 2019-10-12 LAB — URINALYSIS, COMPLETE (UACMP) WITH MICROSCOPIC
Bilirubin Urine: NEGATIVE
Glucose, UA: NEGATIVE mg/dL
Hgb urine dipstick: NEGATIVE
Ketones, ur: NEGATIVE mg/dL
Leukocytes,Ua: NEGATIVE
Nitrite: NEGATIVE
Protein, ur: NEGATIVE mg/dL
RBC / HPF: NONE SEEN RBC/hpf (ref 0–5)
Specific Gravity, Urine: 1.01 (ref 1.005–1.030)
pH: 6.5 (ref 5.0–8.0)

## 2019-10-12 NOTE — Patient Instructions (Signed)
Laser Therapy for Kidney Stones Laser therapy for kidney stones is a procedure to break up small, hard mineral deposits that form in the kidney (kidney stones). The procedure is done using a device that produces a focused beam of light (laser). The laser breaks up kidney stones into pieces that are small enough to be passed out of the body through urination or removed from the body during the procedure. You may need laser therapy if you have kidney stones that are painful or block your urinary tract. This procedure is done by inserting a tube (ureteroscope) into your kidney through the urethral opening. The urethra is the part of the body that drains urine from the bladder. In women, the urethra opens above the vaginal opening. In men, the urethra opens at the tip of the penis. The ureteroscope is inserted through the urethra, and surgical instruments are moved through the bladder and the muscular tube that connects the kidney to the bladder (ureter) until they reach the kidney. Tell a health care provider about:  Any allergies you have.  All medicines you are taking, including vitamins, herbs, eye drops, creams, and over-the-counter medicines.  Any problems you or family members have had with anesthetic medicines.  Any blood disorders you have.  Any surgeries you have had.  Any medical conditions you have.  Whether you are pregnant or may be pregnant. What are the risks? Generally, this is a safe procedure. However, problems may occur, including:  Infection.  Bleeding.  Allergic reactions to medicines.  Damage to the urethra, bladder, or ureter.  Urinary tract infection (UTI).  Narrowing of the urethra (urethral stricture).  Difficulty passing urine.  Blockage of the kidney caused by a fragment of kidney stone. What happens before the procedure? Medicines  Ask your health care provider about: ? Changing or stopping your regular medicines. This is especially important if you  are taking diabetes medicines or blood thinners. ? Taking medicines such as aspirin and ibuprofen. These medicines can thin your blood. Do not take these medicines unless your health care provider tells you to take them. ? Taking over-the-counter medicines, vitamins, herbs, and supplements. Eating and drinking Follow instructions from your health care provider about eating and drinking, which may include:  8 hours before the procedure - stop eating heavy meals or foods, such as meat, fried foods, or fatty foods.  6 hours before the procedure - stop eating light meals or foods, such as toast or cereal.  6 hours before the procedure - stop drinking milk or drinks that contain milk.  2 hours before the procedure - stop drinking clear liquids. Staying hydrated Follow instructions from your health care provider about hydration, which may include:  Up to 2 hours before the procedure - you may continue to drink clear liquids, such as water, clear fruit juice, black coffee, and plain tea.  General instructions  You may have a physical exam before the procedure. You may also have tests, such as imaging tests and blood or urine tests.  If your ureter is too narrow, your health care provider may place a soft, flexible tube (stent) inside of it. The stent may be placed days or weeks before your laser therapy procedure.  Plan to have someone take you home from the hospital or clinic.  If you will be going home right after the procedure, plan to have someone stay with you for 24 hours.  Do not use any products that contain nicotine or tobacco for at least 4   weeks before the procedure. These products include cigarettes, e-cigarettes, and chewing tobacco. If you need help quitting, ask your health care provider.  Ask your health care provider: ? How your surgical site will be marked or identified. ? What steps will be taken to help prevent infection. These may include:  Removing hair at the surgery  site.  Washing skin with a germ-killing soap.  Taking antibiotic medicine. What happens during the procedure?   An IV will be inserted into one of your veins.  You will be given one or more of the following: ? A medicine to help you relax (sedative). ? A medicine to numb the area (local anesthetic). ? A medicine to make you fall asleep (general anesthetic).  A ureteroscope will be inserted into your urethra. The ureteroscope will send images to a video screen in the operating room to guide your surgeon to the area of your kidney that will be treated.  A small, flexible tube will be threaded through the ureteroscope and into your bladder and ureter, up to your kidney.  The laser device will be inserted into your kidney through the tube. Your surgeon will pulse the laser on and off to break up kidney stones.  A surgical instrument that has a tiny wire basket may be inserted through the tube into your kidney to remove the pieces of broken kidney stone. The procedure may vary among health care providers and hospitals. What happens after the procedure?  Your blood pressure, heart rate, breathing rate, and blood oxygen level will be monitored until you leave the hospital or clinic.  You will be given pain medicine as needed.  You may continue to receive antibiotics.  You may have a stent temporarily placed in your ureter.  Do not drive for 24 hours if you were given a sedative during your procedure.  You may be given a strainer to collect any stone fragments that you pass in your urine. Your health care provider may have these tested. Summary  Laser therapy for kidney stones is a procedure to break up kidney stones into pieces that are small enough to be passed out of the body through urination or removed during the procedure.  Follow instructions from your health care provider about eating and drinking before the procedure.  During the procedure, the ureteroscope will send images  to a video screen to guide your surgeon to the area of your kidney that will be treated.  Do not drive for 24 hours if you were given a sedative during your procedure. This information is not intended to replace advice given to you by your health care provider. Make sure you discuss any questions you have with your health care provider. Document Revised: 02/19/2018 Document Reviewed: 02/19/2018 Elsevier Patient Education  2020 [employer]   Ureteral Stent Implantation  Ureteral stent implantation is a procedure to insert (implant) a flexible, soft, plastic tube (stent) into a ureter. Ureters are the tube-like parts of the body that drain urine from the kidneys. The stent supports the ureter while it heals and helps to drain urine. You may have a ureteral stent implanted after having a procedure to remove a blockage from the ureter (ureterolysis or pyeloplasty). You may also have a stent implanted to open the flow of urine when you have a blockage caused by a kidney stone, tumor, blood clot, or infection. You have two ureters, one on each side of the body. The ureters connect the kidneys to the organ that holds urine   until it passes out of the body (bladder). The stent is placed so that one end is in the kidney, and one end is in the bladder. The stent is usually taken out after your ureter has healed. Depending on your condition, you may have a stent for just a few weeks, or you may have a long-term stent that will need to be replaced every few months. Tell a health care provider about:  Any allergies you have.  All medicines you are taking, including vitamins, herbs, eye drops, creams, and over-the-counter medicines.  Any problems you or family members have had with anesthetic medicines.  Any blood disorders you have.  Any surgeries you have had.  Any medical conditions you have.  Whether you are pregnant or may be pregnant. What are the risks? Generally, this is a safe procedure.  However, problems may occur, including:  Infection.  Bleeding.  Allergic reactions to medicines.  Damage to other structures or organs. Tearing (perforation) of the ureter is possible.  Movement of the stent away from where it is placed during surgery (migration). What happens before the procedure? Medicines Ask your health care provider about:  Changing or stopping your regular medicines. This is especially important if you are taking diabetes medicines or blood thinners.  Taking medicines such as aspirin and ibuprofen. These medicines can thin your blood. Do not take these medicines unless your health care provider tells you to take them.  Taking over-the-counter medicines, vitamins, herbs, and supplements. Eating and drinking Follow instructions from your health care provider about eating and drinking, which may include:  8 hours before the procedure - stop eating heavy meals or foods, such as meat, fried foods, or fatty foods.  6 hours before the procedure - stop eating light meals or foods, such as toast or cereal.  6 hours before the procedure - stop drinking milk or drinks that contain milk.  2 hours before the procedure - stop drinking clear liquids. Staying hydrated Follow instructions from your health care provider about hydration, which may include:  Up to 2 hours before the procedure - you may continue to drink clear liquids, such as water, clear fruit juice, black coffee, and plain tea. General instructions  Do not drink alcohol.  Do not use any products that contain nicotine or tobacco for at least 4 weeks before the procedure. These products include cigarettes, e-cigarettes, and chewing tobacco. If you need help quitting, ask your health care provider.  You may have an exam or testing, such as imaging or blood tests.  Ask your health care provider what steps will be taken to help prevent infection. These may include: ? Removing hair at the surgery  site. ? Washing skin with a germ-killing soap. ? Taking antibiotic medicine.  Plan to have someone take you home from the hospital or clinic.  If you will be going home right after the procedure, plan to have someone with you for 24 hours. What happens during the procedure?  An IV will be inserted into one of your veins.  You may be given a medicine to help you relax (sedative).  You may be given a medicine to make you fall asleep (general anesthetic).  A thin, tube-shaped instrument with a light and tiny camera at the end (cystoscope) will be inserted into your urethra. The urethra is the tube that drains urine from the bladder out of the body. In men, the urethra opens at the end of the penis. In women, the urethra opens   in front of the vaginal opening.  The cystoscope will be passed into your bladder.  A thin wire (guide wire) will be passed through your bladder and into your ureter. This is used to guide the stent into your ureter.  The stent will be inserted into your ureter.  The guide wire and the cystoscope will be removed.  A flexible tube (catheter) may be inserted through your urethra so that one end is in your bladder. This helps to drain urine from your bladder. The procedure may vary among hospitals and health care providers. What happens after the procedure?  Your blood pressure, heart rate, breathing rate, and blood oxygen level will be monitored until you leave the hospital or clinic.  You may continue to receive medicine and fluids through an IV.  You may have some soreness or pain in your abdomen and urethra. Medicines will be available to help you.  You will be encouraged to get up and walk around as soon as you can.  You may have a catheter draining your urine.  You will have some blood in your urine.  Do not drive for 24 hours if you were given a sedative during your procedure. Summary  Ureteral stent implantation is a procedure to insert a flexible,  soft, plastic tube (stent) into a ureter.  You may have a stent implanted to support the ureter while it heals after a procedure or to open the flow of urine if there is a blockage.  Follow instructions from your health care provider about taking medicines and about eating and drinking before the procedure.  Depending on your condition, you may have a stent for just a few weeks, or you may have a long-term stent that will need to be replaced every few months. This information is not intended to replace advice given to you by your health care provider. Make sure you discuss any questions you have with your health care provider. Document Revised: 03/17/2018 Document Reviewed: 03/18/2018 Elsevier Patient Education  2020 [employer]   Dietary Guidelines to Help Prevent Kidney Stones Kidney stones are deposits of minerals and salts that form inside your kidneys. Your risk of developing kidney stones may be greater depending on your diet, your lifestyle, the medicines you take, and whether you have certain medical conditions. Most people can reduce their chances of developing kidney stones by following the instructions below. Depending on your overall health and the type of kidney stones you tend to develop, your dietitian may give you more specific instructions. What are tips for following this plan? Reading food labels  Choose foods with "no salt added" or "low-salt" labels. Limit your sodium intake to less than 1500 mg per day.  Choose foods with calcium for each meal and snack. Try to eat about 300 mg of calcium at each meal. Foods that contain 200-500 mg of calcium per serving include: ? 8 oz (237 ml) of milk, fortified nondairy milk, and fortified fruit juice. ? 8 oz (237 ml) of kefir, yogurt, and soy yogurt. ? 4 oz (118 ml) of tofu. ? 1 oz of cheese. ? 1 cup (300 g) of dried figs. ? 1 cup (91 g) of cooked broccoli. ? 1-3 oz can of sardines or mackerel.  Most people need 1000 to 1500  mg of calcium each day. Talk to your dietitian about how much calcium is recommended for you. Shopping  Buy plenty of fresh fruits and vegetables. Most people do not need to avoid fruits and vegetables,   even if they contain nutrients that may contribute to kidney stones.  When shopping for convenience foods, choose: ? Whole pieces of fruit. ? Premade salads with dressing on the side. ? Low-fat fruit and yogurt smoothies.  Avoid buying frozen meals or prepared deli foods.  Look for foods with live cultures, such as yogurt and kefir. Cooking  Do not add salt to food when cooking. Place a salt shaker on the table and allow each person to add his or her own salt to taste.  Use vegetable protein, such as beans, textured vegetable protein (TVP), or tofu instead of meat in pasta, casseroles, and soups. Meal planning   Eat less salt, if told by your dietitian. To do this: ? Avoid eating processed or premade food. ? Avoid eating fast food.  Eat less animal protein, including cheese, meat, poultry, or fish, if told by your dietitian. To do this: ? Limit the number of times you have meat, poultry, fish, or cheese each week. Eat a diet free of meat at least 2 days a week. ? Eat only one serving each day of meat, poultry, fish, or seafood. ? When you prepare animal protein, cut pieces into small portion sizes. For most meat and fish, one serving is about the size of one deck of cards.  Eat at least 5 servings of fresh fruits and vegetables each day. To do this: ? Keep fruits and vegetables on hand for snacks. ? Eat 1 piece of fruit or a handful of berries with breakfast. ? Have a salad and fruit at lunch. ? Have two kinds of vegetables at dinner.  Limit foods that are high in a substance called oxalate. These include: ? Spinach. ? Rhubarb. ? Beets. ? Potato chips and french fries. ? Nuts.  If you regularly take a diuretic medicine, make sure to eat at least 1-2 fruits or vegetables high  in potassium each day. These include: ? Avocado. ? Banana. ? Orange, prune, carrot, or tomato juice. ? Baked potato. ? Cabbage. ? Beans and split peas. General instructions   Drink enough fluid to keep your urine clear or pale yellow. This is the most important thing you can do.  Talk to your health care provider and dietitian about taking daily supplements. Depending on your health and the cause of your kidney stones, you may be advised: ? Not to take supplements with vitamin C. ? To take a calcium supplement. ? To take a daily probiotic supplement. ? To take other supplements such as magnesium, fish oil, or vitamin B6.  Take all medicines and supplements as told by your health care provider.  Limit alcohol intake to no more than 1 drink a day for nonpregnant women and 2 drinks a day for men. One drink equals 12 oz of beer, 5 oz of wine, or 1 oz of hard liquor.  Lose weight if told by your health care provider. Work with your dietitian to find strategies and an eating plan that works best for you. What foods are not recommended? Limit your intake of the following foods, or as told by your dietitian. Talk to your dietitian about specific foods you should avoid based on the type of kidney stones and your overall health. Grains Breads. Bagels. Rolls. Baked goods. Salted crackers. Cereal. Pasta. Vegetables Spinach. Rhubarb. Beets. Canned vegetables. Pickles. Olives. Meats and other protein foods Nuts. Nut butters. Large portions of meat, poultry, or fish. Salted or cured meats. Deli meats. Hot dogs. Sausages. Dairy Cheese. Beverages Regular   soft drinks. Regular vegetable juice. Seasonings and other foods Seasoning blends with salt. Salad dressings. Canned soups. Soy sauce. Ketchup. Barbecue sauce. Canned pasta sauce. Casseroles. Pizza. Lasagna. Frozen meals. Potato chips. French fries. Summary  You can reduce your risk of kidney stones by making changes to your diet.  The most  important thing you can do is drink enough fluid. You should drink enough fluid to keep your urine clear or pale yellow.  Ask your health care provider or dietitian how much protein from animal sources you should eat each day, and also how much salt and calcium you should have each day. This information is not intended to replace advice given to you by your health care provider. Make sure you discuss any questions you have with your health care provider. Document Revised: 09/30/2018 Document Reviewed: 05/21/2016 Elsevier Patient Education  2020 [employer]  

## 2019-10-12 NOTE — Progress Notes (Signed)
10/12/19 3:20 PM   [place] 11/24/1985 [id number]  CC: Left ureteral stone  HPI: I saw Ms. Lindroth in urology clinic today for evaluation of the left ureteral stone.  She is a 34 year old female who was originally seen in the ER on 09/18/2019 with left-sided abdominal and flank pain and CT scan showed a 7 mm left distal ureteral stone with upstream hydronephrosis, as well as a 6 mm left renal stone.  She has been on medical expulsive therapy with Flomax, but has not passed a stone.  She has been straining her urine carefully.  She has had some intermittent left-sided pain over the last few weeks, as well as persistent urgency and frequency.  Urinalysis today is benign with no evidence of infection.  She denies any fevers or chills.  She does have a prior episode of spontaneous stone passage in September 2020.   PMH: Past Medical History:  Diagnosis Date  . Kidney stone   . S/P cholecystectomy 01/2008    Surgical History: Past Surgical History:  Procedure Laterality Date  . CHOLECYSTECTOMY    . DILATION AND EVACUATION N/A 05/01/2018   Procedure: DILATATION AND EVACUATION;  Surgeon: Benjaman Kindler, MD;  Location: [facility] ORS;  Service: Gynecology;  Laterality: N/A;   Family History: Family History  Problem Relation Age of Onset  . Hyperlipidemia Father   . Heart disease Paternal Grandfather     Social History:  reports that she has never smoked. She has never used smokeless tobacco. She reports previous alcohol use. She reports that she does not use drugs.  Physical Exam: BP 115/82 (BP Location: Left Arm, Patient Position: Sitting, Cuff Size: Large)   Pulse 86   Ht 5\' 5"  (1.651 m)   Wt 264 lb (119.7 kg)   LMP 10/04/2019   BMI 43.93 kg/m    Constitutional:  Alert and oriented, No acute distress. Cardiovascular: No clubbing, cyanosis, or edema. Respiratory: Normal respiratory effort, no increased work of breathing. GI: Abdomen is soft, nontender, nondistended, no  abdominal masses GU: Left CVA tenderness Lymph: No cervical or inguinal lymphadenopathy. Skin: No rashes, bruises or suspicious lesions. Neurologic: Grossly intact, no focal deficits, moving all 4 extremities. Psychiatric: Normal mood and affect.  Laboratory Data: Reviewed, see HPI  Pertinent Imaging: I have personally reviewed the CT dated 09/18/2019.  7 mm left distal ureteral stone with upstream hydronephrosis, punctate left lower pole stone, 6 mm left midpole stone.  No significant right-sided nephrolithiasis.  Assessment & Plan:   In summary, she is a 34 year old female with persistent symptoms of intermittent flank pain, urgency, and frequency likely secondary to a 7 mm left distal ureteral stone.  There is no evidence of clinical or laboratory infection.  We discussed various treatment options for urolithiasis including observation with or without medical expulsive therapy, shockwave lithotripsy (SWL), ureteroscopy and laser lithotripsy with stent placement, and percutaneous nephrolithotomy.  We discussed that management is based on stone size, location, density, patient co-morbidities, and patient preference.   Stones <62mm in size have a >80% spontaneous passage rate. Data surrounding the use of tamsulosin for medical expulsive therapy is controversial, but meta analyses suggests it is most efficacious for distal stones between 5-2mm in size. Possible side effects include dizziness/lightheadedness, and retrograde ejaculation.  SWL has a lower stone free rate in a single procedure, but also a lower complication rate compared to ureteroscopy and avoids a stent and associated stent related symptoms. Possible complications include renal hematoma, steinstrasse, and need for additional treatment.  Ureteroscopy with laser lithotripsy and stent placement has a higher stone free rate than SWL in a single procedure, however increased complication rate including possible infection, ureteral  injury, bleeding, and stent related morbidity. Common stent related symptoms include dysuria, urgency/frequency, and flank pain.  I recommended pursuing ureteroscopy in the setting of her stone location and multiple stones on the left side, and she is in agreement.  Schedule left ureteroscopy, laser lithotripsy, stent placement this week Send stone for analysis We will need metabolic work-up with 24-hour urine postop  Legrand Rams, MD 10/12/2019  [facility] [address], [address] [place], [place] [address] [phone]

## 2019-10-13 ENCOUNTER — Encounter
Admission: RE | Admit: 2019-10-13 | Discharge: 2019-10-13 | Disposition: A | Discharge status: Home / Self Care | Payer: 59 | Source: Ambulatory Visit | Attending: Urology | Admitting: Urology

## 2019-10-13 ENCOUNTER — Other Ambulatory Visit: Payer: Self-pay | Admitting: Urology

## 2019-10-13 ENCOUNTER — Other Ambulatory Visit
Admission: RE | Admit: 2019-10-13 | Discharge: 2019-10-13 | Disposition: A | Discharge status: Home / Self Care | Payer: 59 | Source: Ambulatory Visit | Attending: Urology | Admitting: Urology

## 2019-10-13 DIAGNOSIS — Z20822 Contact with and (suspected) exposure to covid-19: Secondary | ICD-10-CM | POA: Diagnosis not present

## 2019-10-13 DIAGNOSIS — Z01812 Encounter for preprocedural laboratory examination: Secondary | ICD-10-CM | POA: Insufficient documentation

## 2019-10-13 DIAGNOSIS — N201 Calculus of ureter: Secondary | ICD-10-CM

## 2019-10-13 HISTORY — DX: Personal history of urinary calculi: Z87.442

## 2019-10-13 LAB — SARS CORONAVIRUS 2 (TAT 6-24 HRS): SARS Coronavirus 2: NEGATIVE

## 2019-10-13 NOTE — Patient Instructions (Signed)
Your procedure is scheduled on: 10/15/19 Report to DAY SURGERY DEPARTMENT LOCATED ON 2ND FLOOR MEDICAL MALL ENTRANCE. To find out your arrival time please call [phone] between 1PM - 3PM on 10/14/19.  Remember: Instructions that are not followed completely may result in serious medical risk, up to and including death, or upon the discretion of your surgeon and anesthesiologist your surgery may need to be rescheduled.     _X__ 1. Do not eat food after midnight the night before your procedure.                 No gum chewing or hard candies. You may drink clear liquids up to 2 hours                 before you are scheduled to arrive for your surgery-                  Clear Liquids include:  water, apple juice without pulp,                 Gatorade (nothing red or purple), Black Coffee or Tea with sweetener (Do not add                creamer). Diabetics water only  __X__2.  On the morning of surgery brush your teeth with toothpaste and water, you                 may rinse your mouth with mouthwash if you wish.  Do not swallow any              toothpaste of mouthwash.     _X__ 3.  No Alcohol for 24 hours before or after surgery.   _X__ 4.  Do Not Smoke or use e-cigarettes For 24 Hours Prior to Your Surgery.                 Do not use any chewable tobacco products for at least 6 hours prior to                 surgery.  ____  5.  Bring all medications with you on the day of surgery if instructed.   __X__  6.  Notify your doctor if there is any change in your medical condition      (cold, fever, infections).     Do not wear jewelry, make-up, hairpins, clips or nail polish. Do not wear lotions, powders, or perfumes.  Do not shave 48 hours prior to surgery. Men may shave face and neck. Do not bring valuables to the hospital.    [facility] is not responsible for any belongings or valuables.  Contacts, dentures/partials or body piercings may not be worn into surgery. Bring a case for your  contacts, glasses or hearing aids, a denture cup will be supplied. Leave your suitcase in the car. After surgery it may be brought to your room. For patients admitted to the hospital, discharge time is determined by your treatment team.   Patients discharged the day of surgery will not be allowed to drive home.   Please read over the following fact sheets that you were given:   MRSA Information  __X__ Take these medicines the morning of surgery with A SIP OF WATER:    1. tamsulosin (FLOMAX) 0.4 MG CAPS capsule  2.   3.   4.  5.  6.  ____ Fleet Enema (as directed)   ____ Use CHG Soap/SAGE wipes as directed  ____ Use inhalers on the day of surgery  ____ Stop metformin/Janumet/Farxiga 2 days prior to surgery    ____ Take 1/2 of usual insulin dose the night before surgery. No insulin the morning          of surgery.   ____ Stop Blood Thinners Coumadin/Plavix/Xarelto/Pleta/Pradaxa/Eliquis/Effient/Aspirin  on   Or contact your Surgeon, Cardiologist or Medical Doctor regarding  ability to stop your blood thinners  __X__ Stop Anti-inflammatories 7 days before surgery such as Advil, Ibuprofen, Motrin,  BC or Goodies Powder, Naprosyn, Naproxen, Aleve, Aspirin    __X__ Stop all herbal supplements, fish oil or vitamin E until after surgery.    ____ Bring C-Pap to the hospital.

## 2019-10-15 ENCOUNTER — Other Ambulatory Visit: Payer: Self-pay | Admitting: Radiology

## 2019-10-15 ENCOUNTER — Telehealth: Payer: Self-pay | Admitting: Radiology

## 2019-10-15 ENCOUNTER — Ambulatory Visit: Payer: 59

## 2019-10-15 ENCOUNTER — Ambulatory Visit
Admission: RE | Admit: 2019-10-15 | Discharge: 2019-10-15 | Disposition: A | Discharge status: Home / Self Care | Payer: 59 | Attending: Urology | Admitting: Urology

## 2019-10-15 ENCOUNTER — Ambulatory Visit: Payer: 59 | Admitting: Certified Registered Nurse Anesthetist

## 2019-10-15 ENCOUNTER — Encounter
Admission: RE | Disposition: A | Discharge status: Home / Self Care | Payer: Self-pay | Source: Home / Self Care | Attending: Urology

## 2019-10-15 ENCOUNTER — Encounter: Payer: Self-pay | Admitting: Urology

## 2019-10-15 ENCOUNTER — Other Ambulatory Visit: Payer: Self-pay

## 2019-10-15 DIAGNOSIS — N202 Calculus of kidney with calculus of ureter: Secondary | ICD-10-CM

## 2019-10-15 DIAGNOSIS — R110 Nausea: Secondary | ICD-10-CM

## 2019-10-15 DIAGNOSIS — N132 Hydronephrosis with renal and ureteral calculous obstruction: Secondary | ICD-10-CM | POA: Diagnosis present

## 2019-10-15 DIAGNOSIS — N201 Calculus of ureter: Secondary | ICD-10-CM

## 2019-10-15 DIAGNOSIS — Z87442 Personal history of urinary calculi: Secondary | ICD-10-CM | POA: Diagnosis not present

## 2019-10-15 DIAGNOSIS — T8859XA Other complications of anesthesia, initial encounter: Secondary | ICD-10-CM

## 2019-10-15 HISTORY — PX: CYSTOSCOPY/URETEROSCOPY/HOLMIUM LASER/STENT PLACEMENT: SHX6546

## 2019-10-15 LAB — POCT PREGNANCY, URINE
Preg Test, Ur: NEGATIVE
Preg Test, Ur: NEGATIVE

## 2019-10-15 SURGERY — CYSTOSCOPY/URETEROSCOPY/HOLMIUM LASER/STENT PLACEMENT
Anesthesia: General | Laterality: Left

## 2019-10-15 MED ORDER — FENTANYL CITRATE (PF) 100 MCG/2ML IJ SOLN
25.0000 ug | INTRAMUSCULAR | Status: DC | PRN
  Administered 2019-10-15: 25 ug via INTRAVENOUS

## 2019-10-15 MED ORDER — ONDANSETRON HCL 4 MG/2ML IJ SOLN
INTRAMUSCULAR | Status: DC | PRN
  Administered 2019-10-15: 4 mg via INTRAVENOUS

## 2019-10-15 MED ORDER — CEFAZOLIN SODIUM-DEXTROSE 2-4 GM/100ML-% IV SOLN
INTRAVENOUS | Status: AC
  Filled 2019-10-15: qty 100

## 2019-10-15 MED ORDER — FENTANYL CITRATE (PF) 100 MCG/2ML IJ SOLN
INTRAMUSCULAR | Status: DC | PRN
  Administered 2019-10-15: 100 ug via INTRAVENOUS

## 2019-10-15 MED ORDER — MIDAZOLAM HCL 2 MG/2ML IJ SOLN
INTRAMUSCULAR | Status: AC
  Filled 2019-10-15: qty 2

## 2019-10-15 MED ORDER — PROMETHAZINE HCL 25 MG PO TABS: 25.0000 mg | ORAL_TABLET | Freq: Four times a day (QID) | ORAL | 0 refills | Status: DC | PRN

## 2019-10-15 MED ORDER — MIDAZOLAM HCL 2 MG/2ML IJ SOLN
INTRAMUSCULAR | Status: DC | PRN
  Administered 2019-10-15: 2 mg via INTRAVENOUS

## 2019-10-15 MED ORDER — TAMSULOSIN HCL 0.4 MG PO CAPS
ORAL_CAPSULE | ORAL | 1 refills | Status: DC
Start: 2019-10-15 — End: 2019-11-24

## 2019-10-15 MED ORDER — CEFAZOLIN SODIUM-DEXTROSE 2-4 GM/100ML-% IV SOLN
2.0000 g | INTRAVENOUS | Status: AC
  Administered 2019-10-15: 2 g via INTRAVENOUS

## 2019-10-15 MED ORDER — SUGAMMADEX SODIUM 200 MG/2ML IV SOLN
INTRAVENOUS | Status: DC | PRN
  Administered 2019-10-15: 400 mg via INTRAVENOUS

## 2019-10-15 MED ORDER — OXYBUTYNIN CHLORIDE ER 10 MG PO TB24
10.0000 mg | ORAL_TABLET | Freq: Every day | ORAL | 0 refills | Status: AC | PRN
Start: 2019-10-15 — End: 2019-10-29

## 2019-10-15 MED ORDER — FAMOTIDINE 20 MG PO TABS
20.0000 mg | ORAL_TABLET | Freq: Once | ORAL | Status: AC
  Administered 2019-10-15: 09:00:00 20 mg via ORAL

## 2019-10-15 MED ORDER — BELLADONNA ALKALOIDS-OPIUM 16.2-60 MG RE SUPP
RECTAL | Status: AC
  Filled 2019-10-15: qty 1

## 2019-10-15 MED ORDER — KETOROLAC TROMETHAMINE 30 MG/ML IJ SOLN
INTRAMUSCULAR | Status: DC | PRN
  Administered 2019-10-15: 15 mg via INTRAVENOUS

## 2019-10-15 MED ORDER — IOHEXOL 180 MG/ML  SOLN
INTRAMUSCULAR | Status: DC | PRN
  Administered 2019-10-15: 20 mL

## 2019-10-15 MED ORDER — DEXAMETHASONE SODIUM PHOSPHATE 10 MG/ML IJ SOLN
INTRAMUSCULAR | Status: DC | PRN
  Administered 2019-10-15: 10 mg via INTRAVENOUS

## 2019-10-15 MED ORDER — FAMOTIDINE 20 MG PO TABS
ORAL_TABLET | ORAL | Status: AC
  Filled 2019-10-15: qty 1

## 2019-10-15 MED ORDER — LACTATED RINGERS IV SOLN: INTRAVENOUS | Status: DC

## 2019-10-15 MED ORDER — DEXMEDETOMIDINE HCL IN NACL 400 MCG/100ML IV SOLN
INTRAVENOUS | Status: DC | PRN
  Administered 2019-10-15: 8 ug via INTRAVENOUS

## 2019-10-15 MED ORDER — PROPOFOL 500 MG/50ML IV EMUL
INTRAVENOUS | Status: AC
  Filled 2019-10-15: qty 50

## 2019-10-15 MED ORDER — FENTANYL CITRATE (PF) 100 MCG/2ML IJ SOLN
INTRAMUSCULAR | Status: AC
  Filled 2019-10-15: qty 2

## 2019-10-15 MED ORDER — FENTANYL CITRATE (PF) 100 MCG/2ML IJ SOLN
INTRAMUSCULAR | Status: AC
  Administered 2019-10-15: 12:00:00 25 ug via INTRAVENOUS
  Filled 2019-10-15: qty 2

## 2019-10-15 MED ORDER — LIDOCAINE HCL (CARDIAC) PF 100 MG/5ML IV SOSY
PREFILLED_SYRINGE | INTRAVENOUS | Status: DC | PRN
  Administered 2019-10-15: 100 mg via INTRAVENOUS

## 2019-10-15 MED ORDER — PROPOFOL 10 MG/ML IV BOLUS
INTRAVENOUS | Status: DC | PRN
  Administered 2019-10-15: 150 mg via INTRAVENOUS

## 2019-10-15 MED ORDER — ROCURONIUM BROMIDE 100 MG/10ML IV SOLN
INTRAVENOUS | Status: DC | PRN
  Administered 2019-10-15: 60 mg via INTRAVENOUS

## 2019-10-15 MED ORDER — ONDANSETRON HCL 4 MG/2ML IJ SOLN: 4.0000 mg | Freq: Once | INTRAMUSCULAR | Status: DC | PRN

## 2019-10-15 MED ORDER — BELLADONNA ALKALOIDS-OPIUM 16.2-60 MG RE SUPP
RECTAL | Status: DC | PRN
  Administered 2019-10-15: 1 via RECTAL

## 2019-10-15 MED ORDER — PROPOFOL 10 MG/ML IV BOLUS
INTRAVENOUS | Status: AC
  Filled 2019-10-15: qty 20

## 2019-10-15 SURGICAL SUPPLY — 32 items
BAG DRAIN CYSTO-URO LG1000N (MISCELLANEOUS) ×3 IMPLANT
BRUSH SCRUB EZ 1% IODOPHOR (MISCELLANEOUS) ×3 IMPLANT
CATH URETL 5X70 OPEN END (CATHETERS) IMPLANT
CNTNR SPEC 2.5X3XGRAD LEK (MISCELLANEOUS) ×1
CONT SPEC 4OZ STER OR WHT (MISCELLANEOUS) ×2
CONTAINER SPEC 2.5X3XGRAD LEK (MISCELLANEOUS) IMPLANT
DRAPE UTILITY 15X26 TOWEL STRL (DRAPES) ×3 IMPLANT
FIBER LASER TRAC TIP (UROLOGICAL SUPPLIES) ×2 IMPLANT
GLOVE BIOGEL PI IND STRL 7.5 (GLOVE) ×1 IMPLANT
GLOVE BIOGEL PI INDICATOR 7.5 (GLOVE) ×2
GOWN STRL REUS W/ TWL LRG LVL3 (GOWN DISPOSABLE) ×1 IMPLANT
GOWN STRL REUS W/ TWL XL LVL3 (GOWN DISPOSABLE) ×1 IMPLANT
GOWN STRL REUS W/TWL LRG LVL3 (GOWN DISPOSABLE) ×2
GOWN STRL REUS W/TWL XL LVL3 (GOWN DISPOSABLE) ×2
GUIDEWIRE STR DUAL SENSOR (WIRE) ×5 IMPLANT
INFUSOR MANOMETER BAG 3000ML (MISCELLANEOUS) ×3 IMPLANT
INTRODUCER DILATOR DOUBLE (INTRODUCER) ×2 IMPLANT
KIT BALLN UROMAX 15FX4 (MISCELLANEOUS) IMPLANT
KIT BALLN UROMAX 26 75X4 (MISCELLANEOUS) ×2
KIT TURNOVER CYSTO (KITS) ×3 IMPLANT
PACK CYSTO AR (MISCELLANEOUS) ×3 IMPLANT
SET CYSTO W/LG BORE CLAMP LF (SET/KITS/TRAYS/PACK) ×3 IMPLANT
SHEATH URETERAL 12FRX35CM (MISCELLANEOUS) IMPLANT
SOL .9 NS 3000ML IRR  AL (IV SOLUTION) ×2
SOL .9 NS 3000ML IRR UROMATIC (IV SOLUTION) ×1 IMPLANT
STENT URET 6FRX24 CONTOUR (STENTS) IMPLANT
STENT URET 6FRX26 CONTOUR (STENTS) IMPLANT
STENT URET 6FRX28 CONTOUR (STENTS) ×2 IMPLANT
SURGILUBE 2OZ TUBE FLIPTOP (MISCELLANEOUS) ×3 IMPLANT
SYR 10ML LL (SYRINGE) ×3 IMPLANT
VALVE UROSEAL ADJ ENDO (VALVE) ×2 IMPLANT
WATER STERILE IRR 1000ML POUR (IV SOLUTION) ×3 IMPLANT

## 2019-10-15 NOTE — Telephone Encounter (Signed)
Patient's significant other, Arlys John, reports patient has been sleeping since returning home frm surgery. She woke up in severe pain and took percocet and immediately began vomiting. She is unable to keep anything down. Patient was given one zofran tablet. Advised that patient could be given a second zofran tablet per order. Also advised that may be from anesthesia, narcotic, or stent. If Fever over 101.3 would have to come back to ED, would minimize narcotics and take nsaids for pain, ok to try phenergan 25mg  q6 instead of zofran per Dr . Richardo Hanks verbalized understanding.

## 2019-10-15 NOTE — Anesthesia Postprocedure Evaluation (Signed)
Anesthesia Post Note  Patient: Rebecca Owens [facility]  Procedure(s) Performed: CYSTOSCOPY/URETEROSCOPY/HOLMIUM LASER/STENT PLACEMENT (Left )  Patient location during evaluation: PACU Anesthesia Type: General Level of consciousness: awake and alert Pain management: pain level controlled Vital Signs Assessment: post-procedure vital signs reviewed and stable Respiratory status: spontaneous breathing and respiratory function stable Cardiovascular status: stable Anesthetic complications: no     Last Vitals:  Vitals:   10/15/19 1123 10/15/19 1127  BP: (!) 103/52   Pulse: 97 95  Resp: 19 19  Temp:  (!) 35.9 C  SpO2: 97% 97%    Last Pain:  Vitals:   10/15/19 1120  TempSrc:   PainSc: 0-No pain                 Race Latour K

## 2019-10-15 NOTE — Anesthesia Procedure Notes (Signed)
Procedure Name: Intubation Date/Time: 10/15/2019 10:05 AM Performed by: Louann Sjogren, CRNA Pre-anesthesia Checklist: Patient identified, Patient being monitored, Timeout performed, Emergency Drugs available and Suction available Patient Re-evaluated:Patient Re-evaluated prior to induction Oxygen Delivery Method: Circle system utilized Preoxygenation: Pre-oxygenation with 100% oxygen Induction Type: IV induction Ventilation: Mask ventilation without difficulty Laryngoscope Size: Mac and 4 Grade View: Grade I Tube type: Oral Tube size: 7.5 mm Number of attempts: 1 Airway Equipment and Method: Stylet Placement Confirmation: ETT inserted through vocal cords under direct vision,  positive ETCO2 and breath sounds checked- equal and bilateral Secured at: 21 cm Tube secured with: Tape Dental Injury: Teeth and Oropharynx as per pre-operative assessment

## 2019-10-15 NOTE — Transfer of Care (Signed)
Immediate Anesthesia Transfer of Care Note  Patient: Rebecca Owens [facility]  Procedure(s) Performed: CYSTOSCOPY/URETEROSCOPY/HOLMIUM LASER/STENT PLACEMENT (Left )  Patient Location: PACU  Anesthesia Type:General  Level of Consciousness: awake and alert   Airway & Oxygen Therapy: Patient Spontanous Breathing  Post-op Assessment: Report given to RN  Post vital signs: Reviewed  Last Vitals:  Vitals Value Taken Time  BP 103/52 10/15/19 1123  Temp    Pulse 96 10/15/19 1123  Resp 18 10/15/19 1123  SpO2 97 % 10/15/19 1123  Vitals shown include unvalidated device data.  Last Pain:  Vitals:   10/15/19 0921  TempSrc: Temporal  PainSc: 0-No pain         Complications: No apparent anesthesia complications

## 2019-10-15 NOTE — Op Note (Signed)
Date of procedure: 10/15/19  Preoperative diagnosis:  1. Left distal ureteral stone 2. Left renal stone  Postoperative diagnosis:  1. Left distal ureteral stone 2. Left renal stone  Procedure: 1. Cystoscopy, left retrograde pyelogram with intraoperative interpretation 2. Left ureteroscopy and laser lithotripsy of distal ureteral stone 3. Left ureteroscopy and laser lithotripsy of renal stone 4. Left ureteral stent placement  Surgeon: Nickolas Madrid, MD  Anesthesia: General  Complications: None  Intraoperative findings:  1.  Normal cystoscopy 2.  Tight left distal ureter requiring balloon dilation to access distal ureteral stone 3.  Uncomplicated dusting of left distal ureteral stone 4.  6 mm left renal stone in calyx with a very narrow infundibulum requiring laser to open and access stone, stone dusted 5.  Uncomplicated stent placement  EBL: None  Specimens: Stone for analysis  Drains: Left 6 French by 28 cm ureteral stent  Indication: Rebecca Owens is a 34 y.o. patient with intermittent left groin pain and urgency/frequency over the last few weeks with a 7 mm left distal ureteral stone on CT, as well as a 6 mm renal stone.  After reviewing the management options for treatment, they elected to proceed with the above surgical procedure(s). We have discussed the potential benefits and risks of the procedure, side effects of the proposed treatment, the likelihood of the patient achieving the goals of the procedure, and any potential problems that might occur during the procedure or recuperation. Informed consent has been obtained.  Description of procedure:  The patient was taken to the operating room and general anesthesia was induced. SCDs were placed for DVT prophylaxis. The patient was placed in the dorsal lithotomy position, prepped and draped in the usual sterile fashion, and preoperative antibiotics were administered. A preoperative time-out was performed.   A 21 French  rigid cystoscope was used to intubate the urethra.  Thorough cystoscopy was performed and was grossly normal.  The left distal stone in the renal stone could clearly be seen on fluoroscopy.  A sensor wire advanced easily alongside the stone up into the renal pelvis under fluoroscopic vision.  I attempted to pass the semirigid ureteroscope alongside the wire, however met resistance in the distal ureter.  The stone could be seen on the other side of the narrowing.  I attempted to dilate this area with the dual-lumen access catheter, but I could still not access the stone with the semirigid ureteroscope.  I then performed balloon dilation with a 15 [place] UroMax dilating balloon just distal to the stone to a pressure of 18.  I was unable to advance the semirigid ureteroscope alongside the wire to the level of the stone and a yellow and black hard stone was identified.  This was fragmented to dust using the 200 m laser fiber on settings of 0.5 J and 20 Hz.  A second sensor wire was then added through the semirigid scope as a safety and advanced up to the kidney under fluoroscopic vision.  A single channel flexible ureteroscope was then advanced over the wire up into the kidney and passed easily.  Thorough pyeloscopy revealed a bifid system.  In the lower pole there were a small 3 mm stone and it was fragmented to dust.  I then was able to identify a narrow infundibulum leading to a calyx containing the 6 mm stone in the midpole.  The laser was used to open the infundibulum to allow access to the stone.  The stone was then dusted using a 200 m  laser fiber on settings of 0.5 J and 20 Hz.  All fragments were <1 mm.  Thorough pyeloscopy revealed no other stones.  The safety wire was located in the upper pole.  A retrograde pyelogram performed from the proximal ureter showed no evidence of extravasation or filling defects.  Careful pullback ureteroscopy demonstrated no residual stones and the ureter was cleared.  There  was no obvious ureteral injury.  A 6 French by 28 cm ureteral stent was placed over the wire into the upper pole, there was a subtle shepherd's hook in the upper pole secondary to the length of the bifid system.  There was an excellent curl in the bladder, and contrast drained briskly through the side ports of the stent.  Stone fragments were irrigated free and sent for analysis.  A belladonna suppository was placed.  Disposition: Stable to PACU  Plan: Follow-up in clinic in 1 week for stent removal We will need 18-EXHB urine metabolic work-up and follow-up, as well as 6 week renal ultrasound to evaluate for silent hydro-  Nickolas Madrid, MD

## 2019-10-15 NOTE — Anesthesia Preprocedure Evaluation (Signed)
Anesthesia Evaluation  Patient identified by MRN, date of birth, ID band Patient awake    Reviewed: Allergy & Precautions, NPO status , Patient's Chart, lab work & pertinent test results  History of Anesthesia Complications Negative for: history of anesthetic complications  Airway Mallampati: III       Dental   Pulmonary neg sleep apnea, neg COPD, Not current smoker,           Cardiovascular (-) hypertension(-) Past MI and (-) CHF (-) dysrhythmias (-) Valvular Problems/Murmurs     Neuro/Psych neg Seizures    GI/Hepatic Neg liver ROS, neg GERD  ,  Endo/Other  negative endocrine ROSneg diabetes  Renal/GU Renal disease (stones)     Musculoskeletal   Abdominal   Peds  Hematology   Anesthesia Other Findings   Reproductive/Obstetrics                             Anesthesia Physical Anesthesia Plan  ASA: II  Anesthesia Plan: General   Post-op Pain Management:    Induction: Intravenous  PONV Risk Score and Plan: 3 and Ondansetron, Dexamethasone and Midazolam  Airway Management Planned: Oral ETT  Additional Equipment:   Intra-op Plan:   Post-operative Plan:   Informed Consent: I have reviewed the patients History and Physical, chart, labs and discussed the procedure including the risks, benefits and alternatives for the proposed anesthesia with the patient or authorized representative who has indicated his/her understanding and acceptance.       Plan Discussed with:   Anesthesia Plan Comments:         Anesthesia Quick Evaluation

## 2019-10-15 NOTE — Discharge Instructions (Signed)

## 2019-10-15 NOTE — H&P (Signed)
UROLOGY H&P UPDATE  Agree with prior H&P dated 10/12/19.  34 year old female with 7 mm left distal ureteral stone and persistent urgency/frequency/groin pain, as well as 6 mm left renal stone.  Cardiac: RRR Lungs: CTA bilaterally  Laterality: Left Procedure: Left ureteroscopy, laser lithotripsy, stent placement  Urine: Urinalysis 4/20 0-5 RBCs, 0-5 WBCs, few bacteria, nitrite negative, no leukocytes  We specifically discussed the risks ureteroscopy including bleeding, infection/sepsis, stent related symptoms including flank pain/urgency/frequency/incontinence/dysuria, ureteral injury, inability to access stone, or need for staged or additional procedures.   Sondra Come, MD 10/15/2019

## 2019-10-21 ENCOUNTER — Encounter: Payer: Self-pay | Admitting: Urology

## 2019-10-21 ENCOUNTER — Telehealth: Payer: Self-pay

## 2019-10-21 ENCOUNTER — Other Ambulatory Visit: Payer: Self-pay

## 2019-10-21 ENCOUNTER — Ambulatory Visit (INDEPENDENT_AMBULATORY_CARE_PROVIDER_SITE_OTHER): Payer: 59 | Admitting: Urology

## 2019-10-21 VITALS — BP 120/80 | HR 90 | Ht 65.0 in | Wt 264.0 lb

## 2019-10-21 DIAGNOSIS — N201 Calculus of ureter: Secondary | ICD-10-CM | POA: Diagnosis not present

## 2019-10-21 LAB — CALCULI, WITH PHOTOGRAPH (CLINICAL LAB)
Calcium Oxalate Monohydrate: 100 %
Weight Calculi: 11 mg

## 2019-10-21 MED ORDER — LIDOCAINE HCL URETHRAL/MUCOSAL 2 % EX GEL
1.0000 "application " | Freq: Once | CUTANEOUS | Status: AC
  Administered 2019-10-21: 1 via URETHRAL

## 2019-10-21 MED ORDER — SULFAMETHOXAZOLE-TRIMETHOPRIM 800-160 MG PO TABS
1.0000 | ORAL_TABLET | Freq: Two times a day (BID) | ORAL | 0 refills | Status: DC
Start: 2019-10-21 — End: 2019-11-24

## 2019-10-21 MED ORDER — SULFAMETHOXAZOLE-TRIMETHOPRIM 800-160 MG PO TABS
1.0000 | ORAL_TABLET | Freq: Once | ORAL | Status: AC
  Administered 2019-10-21: 1 via ORAL

## 2019-10-21 MED ORDER — KETOROLAC TROMETHAMINE 10 MG PO TABS: 10.0000 mg | ORAL_TABLET | Freq: Four times a day (QID) | ORAL | 0 refills | Status: DC | PRN

## 2019-10-21 NOTE — Progress Notes (Signed)
Cystoscopy Procedure Note:  Indication: Stent removal s/p 10/15/2019 left ureteroscopy for 6 mm left distal ureteral stone as well as a 6 mm left renal stone and a narrow infundibulum requiring infundibulotomy  After informed consent and discussion of the procedure and its risks, Rebecca Owens was positioned and prepped in the standard fashion. Cystoscopy was performed with a flexible cystoscope. The stent was grasped with flexible graspers and removed in its entirety. The patient tolerated the procedure well.  Findings: Uncomplicated stent removal  Assessment and Plan: Follow up in 6 weeks with renal ultrasound and 24-hour urine results Bactrim DS x 3 days for equivocal urine, urine sent for culture  Sondra Come, MD 10/21/2019

## 2019-10-21 NOTE — Patient Instructions (Signed)
Ureteral Stent Implantation, Care After This sheet gives you information about how to care for yourself after your procedure. Your health care provider may also give you more specific instructions. If you have problems or questions, contact your health care provider. What can I expect after the procedure? After the procedure, it is common to have:  Nausea.  Mild pain when you urinate. You may feel this pain in your lower back or lower abdomen. The pain should stop within a few minutes after you urinate. This may last for up to 1 week.  A small amount of blood in your urine for several days. Follow these instructions at home: Medicines  Take over-the-counter and prescription medicines only as told by your health care provider.  If you were prescribed an antibiotic medicine, take it as told by your health care provider. Do not stop taking the antibiotic even if you start to feel better.  Do not drive for 24 hours if you were given a sedative during your procedure.  Ask your health care provider if the medicine prescribed to you requires you to avoid driving or using heavy machinery. Activity  Rest as told by your health care provider.  Avoid sitting for a long time without moving. Get up to take short walks every 1-2 hours. This is important to improve blood flow and breathing. Ask for help if you feel weak or unsteady.  Return to your normal activities as told by your health care provider. Ask your health care provider what activities are safe for you. General instructions   Watch for any blood in your urine. Call your health care provider if the amount of blood in your urine increases.  If you have a catheter: ? Follow instructions from your health care provider about taking care of your catheter and collection bag. ? Do not take baths, swim, or use a hot tub until your health care provider approves. Ask your health care provider if you may take showers. You may only be allowed to  take sponge baths.  Drink enough fluid to keep your urine pale yellow.  Do not use any products that contain nicotine or tobacco, such as cigarettes, e-cigarettes, and chewing tobacco. These can delay healing after surgery. If you need help quitting, ask your health care provider.  Keep all follow-up visits as told by your health care provider. This is important. Contact a health care provider if:  You have pain that gets worse or does not get better with medicine, especially pain when you urinate.  You have difficulty urinating.  You feel nauseous or you vomit repeatedly during a period of more than 2 days after the procedure. Get help right away if:  Your urine is dark red or has blood clots in it.  You are leaking urine (have incontinence).  The end of the stent comes out of your urethra.  You cannot urinate.  You have sudden, sharp, or severe pain in your abdomen or lower back.  You have a fever.  You have swelling or pain in your legs.  You have difficulty breathing. Summary  After the procedure, it is common to have mild pain when you urinate that goes away within a few minutes after you urinate. This may last for up to 1 week.  Watch for any blood in your urine. Call your health care provider if the amount of blood in your urine increases.  Take over-the-counter and prescription medicines only as told by your health care provider.  Drink  enough fluid to keep your urine pale yellow. This information is not intended to replace advice given to you by your health care provider. Make sure you discuss any questions you have with your health care provider. Document Revised: 03/17/2018 Document Reviewed: 03/18/2018 Elsevier Patient Education  2020 [employer]  You will receive a box/kit in the mail that will have a urine jug and instructions in the kit.  When the box arrives you will need to call our office  [phone] to schedule a LAB appointment.  You will need to do a 24hour urine and this should be done during the days that our office will be open.  For example any day from Sunday through Thursday.  If you take Vitamin C 1107m or greater please stop this 5 days prior to collection.  How to collect the urine sample: On the day you start the urine sample this 1st morning urine should NOT be collected.  For the rest of the day including all night urines should be collected.  On the next morning the 1st urine should be collected and then you will be finished with the urine collections.  You will need to bring the box with you on your LAB appointment day after urine has been collected and all instructions are complete in the box.  Your blood will be drawn and the box will be collected by our Lab employee to be sent off for analysis.  When urine and blood is complete you will need to schedule a follow up appointment for lab results.

## 2019-10-21 NOTE — Telephone Encounter (Signed)
Patient called the office today post stent removal stating that she was having significant pain in her flank and bladder. It was explained that after stent removal you may experience ureteral spasms and/or bladder spasms. She states she has tried tylenol with no relief. Patient was encouraged to utilize the oxybutinin and flomax given as well as advil. Per Dr. Richardo Hanks will send in a script for Toradol to use if OTC NSAIDs do not help with pain. Patient was instructed that if her pain worsened or if she developed fever, chills, nausea, or vomiting she should go to the ER. Patient verbalized understanding

## 2019-10-21 NOTE — Telephone Encounter (Signed)
[employer] order form faxed with confirmation.

## 2019-10-25 LAB — MICROSCOPIC EXAMINATION: RBC, Urine: >30 /hpf — AB (ref 0–2)

## 2019-10-25 LAB — URINALYSIS, COMPLETE
Bilirubin, UA: NEGATIVE
Glucose, UA: NEGATIVE
Nitrite, UA: POSITIVE — AB
Specific Gravity, UA: 1.02 (ref 1.005–1.030)
Urobilinogen, Ur: 2 mg/dL — ABNORMAL HIGH (ref 0.2–1.0)
pH, UA: 5.5 (ref 5.0–7.5)

## 2019-11-15 ENCOUNTER — Other Ambulatory Visit: Payer: Self-pay

## 2019-11-15 ENCOUNTER — Other Ambulatory Visit: Payer: 59

## 2019-11-18 ENCOUNTER — Other Ambulatory Visit: Payer: Self-pay | Admitting: Urology

## 2019-11-24 ENCOUNTER — Other Ambulatory Visit: Payer: Self-pay

## 2019-11-24 ENCOUNTER — Ambulatory Visit: Payer: 59 | Admitting: Urology

## 2019-11-24 ENCOUNTER — Encounter: Payer: Self-pay | Admitting: Urology

## 2019-11-24 VITALS — BP 119/86 | HR 99 | Ht 65.0 in | Wt 264.0 lb

## 2019-11-24 DIAGNOSIS — N200 Calculus of kidney: Secondary | ICD-10-CM | POA: Diagnosis not present

## 2019-11-24 DIAGNOSIS — N201 Calculus of ureter: Secondary | ICD-10-CM | POA: Diagnosis not present

## 2019-11-24 NOTE — Progress Notes (Signed)
   11/24/2019 9:49 AM   Rebecca Owens 1985/09/19 [id number]  Reason for visit: Review 24-hour urine results  HPI: I saw Rebecca Owens in urology clinic today to review her 24-hour urine results.  She is a 34 year old female with 2 prior episodes of nephrolithiasis, most recently in April 2021 when I treated a left distal ureteral stone as well as a left renal stone within a narrow calyx with ureteroscopy.  She denies any complaints since that time and is doing well.  She has not yet completed her 6-week follow-up ultrasound.  Stone analysis was 100% calcium oxalate.  A 24-hour urine was completed and was notable for a low urine volume of 1.73 L, supersaturation of calcium oxalate 7.77, mildly elevated urinary oxalate of 41, normal urine calcium of 167, good urine citrate of 588, and urine sodium was normal at 129.  We reviewed her 24-hour urine results at length.  I recommended increasing fluid intake with a goal of 2.5 L of urine output per day, decreasing animal proteins in the diet, and taking calcium with meals.  We will call with renal ultrasound results for standard ureteroscopy follow-up RTC 6 months with KUB prior  Sondra Come, MD  [facility] [address] [place], [place] [address] [phone]

## 2019-11-24 NOTE — Patient Instructions (Addendum)
1.  Increase fluid intake with goal of 2.5 L of urine output per day 2.  Take in calcium from foods with every meal 3.  Decrease animal protein in the diet    Dietary Guidelines to Help Prevent Kidney Stones Kidney stones are deposits of minerals and salts that form inside your kidneys. Your risk of developing kidney stones may be greater depending on your diet, your lifestyle, the medicines you take, and whether you have certain medical conditions. Most people can reduce their chances of developing kidney stones by following the instructions below. Depending on your overall health and the type of kidney stones you tend to develop, your dietitian may give you more specific instructions. What are tips for following this plan? Reading food labels  Choose foods with "no salt added" or "low-salt" labels. Limit your sodium intake to less than 1500 mg per day.  Choose foods with calcium for each meal and snack. Try to eat about 300 mg of calcium at each meal. Foods that contain 200-500 mg of calcium per serving include: ? 8 oz (237 ml) of milk, fortified nondairy milk, and fortified fruit juice. ? 8 oz (237 ml) of kefir, yogurt, and soy yogurt. ? 4 oz (118 ml) of tofu. ? 1 oz of cheese. ? 1 cup (300 g) of dried figs. ? 1 cup (91 g) of cooked broccoli. ? 1-3 oz can of sardines or mackerel.  Most people need 1000 to 1500 mg of calcium each day. Talk to your dietitian about how much calcium is recommended for you. Shopping  Buy plenty of fresh fruits and vegetables. Most people do not need to avoid fruits and vegetables, even if they contain nutrients that may contribute to kidney stones.  When shopping for convenience foods, choose: ? Whole pieces of fruit. ? Premade salads with dressing on the side. ? Low-fat fruit and yogurt smoothies.  Avoid buying frozen meals or prepared deli foods.  Look for foods with live cultures, such as yogurt and kefir. Cooking  Do not add salt to food when  cooking. Place a salt shaker on the table and allow each person to add his or her own salt to taste.  Use vegetable protein, such as beans, textured vegetable protein (TVP), or tofu instead of meat in pasta, casseroles, and soups. Meal planning   Eat less salt, if told by your dietitian. To do this: ? Avoid eating processed or premade food. ? Avoid eating fast food.  Eat less animal protein, including cheese, meat, poultry, or fish, if told by your dietitian. To do this: ? Limit the number of times you have meat, poultry, fish, or cheese each week. Eat a diet free of meat at least 2 days a week. ? Eat only one serving each day of meat, poultry, fish, or seafood. ? When you prepare animal protein, cut pieces into small portion sizes. For most meat and fish, one serving is about the size of one deck of cards.  Eat at least 5 servings of fresh fruits and vegetables each day. To do this: ? Keep fruits and vegetables on hand for snacks. ? Eat 1 piece of fruit or a handful of berries with breakfast. ? Have a salad and fruit at lunch. ? Have two kinds of vegetables at dinner.  Limit foods that are high in a substance called oxalate. These include: ? Spinach. ? Rhubarb. ? Beets. ? Potato chips and french fries. ? Nuts.  If you regularly take a diuretic medicine, make  sure to eat at least 1-2 fruits or vegetables high in potassium each day. These include: ? Avocado. ? Banana. ? Orange, prune, carrot, or tomato juice. ? Baked potato. ? Cabbage. ? Beans and split peas. General instructions   Drink enough fluid to keep your urine clear or pale yellow. This is the most important thing you can do.  Talk to your health care provider and dietitian about taking daily supplements. Depending on your health and the cause of your kidney stones, you may be advised: ? Not to take supplements with vitamin C. ? To take a calcium supplement. ? To take a daily probiotic supplement. ? To take other  supplements such as magnesium, fish oil, or vitamin B6.  Take all medicines and supplements as told by your health care provider.  Limit alcohol intake to no more than 1 drink a day for nonpregnant women and 2 drinks a day for men. One drink equals 12 oz of beer, 5 oz of wine, or 1 oz of hard liquor.  Lose weight if told by your health care provider. Work with your dietitian to find strategies and an eating plan that works best for you. What foods are not recommended? Limit your intake of the following foods, or as told by your dietitian. Talk to your dietitian about specific foods you should avoid based on the type of kidney stones and your overall health. Grains Breads. Bagels. Rolls. Baked goods. Salted crackers. Cereal. Pasta. Vegetables Spinach. Rhubarb. Beets. Canned vegetables. Rosita Fire. Olives. Meats and other protein foods Nuts. Nut butters. Large portions of meat, poultry, or fish. Salted or cured meats. Deli meats. Hot dogs. Sausages. Dairy Cheese. Beverages Regular soft drinks. Regular vegetable juice. Seasonings and other foods Seasoning blends with salt. Salad dressings. Canned soups. Soy sauce. Ketchup. Barbecue sauce. Canned pasta sauce. Casseroles. Pizza. Lasagna. Frozen meals. Potato chips. [place] fries. Summary  You can reduce your risk of kidney stones by making changes to your diet.  The most important thing you can do is drink enough fluid. You should drink enough fluid to keep your urine clear or pale yellow.  Ask your health care provider or dietitian how much protein from animal sources you should eat each day, and also how much salt and calcium you should have each day. This information is not intended to replace advice given to you by your health care provider. Make sure you discuss any questions you have with your health care provider. Document Revised: 09/30/2018 Document Reviewed: 05/21/2016 Elsevier Patient Education  2020 [employer].

## 2019-11-29 DIAGNOSIS — M5416 Radiculopathy, lumbar region: Secondary | ICD-10-CM | POA: Insufficient documentation

## 2019-12-20 ENCOUNTER — Other Ambulatory Visit: Payer: Self-pay

## 2019-12-20 ENCOUNTER — Ambulatory Visit
Admission: RE | Admit: 2019-12-20 | Discharge: 2019-12-20 | Disposition: A | Discharge status: Home / Self Care | Payer: 59 | Source: Ambulatory Visit | Attending: Urology | Admitting: Urology

## 2019-12-20 DIAGNOSIS — N201 Calculus of ureter: Secondary | ICD-10-CM | POA: Insufficient documentation

## 2019-12-22 ENCOUNTER — Telehealth: Payer: Self-pay

## 2019-12-22 NOTE — Telephone Encounter (Signed)
Called pt informed her of the information below. Pt gave verbal understanding.  

## 2019-12-22 NOTE — Telephone Encounter (Signed)
-----   Message from Sondra Come, MD sent at 12/21/2019  8:28 AM EDT ----- Renal US looks good, no residual kidney swelling, follow up as scheduled, thanks  Legrand Rams, MD 12/21/2019

## 2020-04-03 DIAGNOSIS — O09893 Supervision of other high risk pregnancies, third trimester: Secondary | ICD-10-CM

## 2020-05-25 ENCOUNTER — Ambulatory Visit: Payer: 59 | Admitting: Urology

## 2020-05-25 ENCOUNTER — Other Ambulatory Visit: Payer: Self-pay

## 2020-05-25 ENCOUNTER — Encounter: Payer: Self-pay | Admitting: Urology

## 2020-05-25 VITALS — BP 125/87 | HR 92 | Ht 65.0 in | Wt 267.0 lb

## 2020-05-25 DIAGNOSIS — N200 Calculus of kidney: Secondary | ICD-10-CM | POA: Diagnosis not present

## 2020-05-25 NOTE — Patient Instructions (Signed)
Dietary Guidelines to Help Prevent Kidney Stones Kidney stones are deposits of minerals and salts that form inside your kidneys. Your risk of developing kidney stones may be greater depending on your diet, your lifestyle, the medicines you take, and whether you have certain medical conditions. Most people can reduce their chances of developing kidney stones by following the instructions below. Depending on your overall health and the type of kidney stones you tend to develop, your dietitian may give you more specific instructions. What are tips for following this plan? Reading food labels  Choose foods with "no salt added" or "low-salt" labels. Limit your sodium intake to less than 1500 mg per day.  Choose foods with calcium for each meal and snack. Try to eat about 300 mg of calcium at each meal. Foods that contain 200-500 mg of calcium per serving include: ? 8 oz (237 ml) of milk, fortified nondairy milk, and fortified fruit juice. ? 8 oz (237 ml) of kefir, yogurt, and soy yogurt. ? 4 oz (118 ml) of tofu. ? 1 oz of cheese. ? 1 cup (300 g) of dried figs. ? 1 cup (91 g) of cooked broccoli. ? 1-3 oz can of sardines or mackerel.  Most people need 1000 to 1500 mg of calcium each day. Talk to your dietitian about how much calcium is recommended for you. Shopping  Buy plenty of fresh fruits and vegetables. Most people do not need to avoid fruits and vegetables, even if they contain nutrients that may contribute to kidney stones.  When shopping for convenience foods, choose: ? Whole pieces of fruit. ? Premade salads with dressing on the side. ? Low-fat fruit and yogurt smoothies.  Avoid buying frozen meals or prepared deli foods.  Look for foods with live cultures, such as yogurt and kefir. Cooking  Do not add salt to food when cooking. Place a salt shaker on the table and allow each person to add his or her own salt to taste.  Use vegetable protein, such as beans, textured vegetable  protein (TVP), or tofu instead of meat in pasta, casseroles, and soups. Meal planning   Eat less salt, if told by your dietitian. To do this: ? Avoid eating processed or premade food. ? Avoid eating fast food.  Eat less animal protein, including cheese, meat, poultry, or fish, if told by your dietitian. To do this: ? Limit the number of times you have meat, poultry, fish, or cheese each week. Eat a diet free of meat at least 2 days a week. ? Eat only one serving each day of meat, poultry, fish, or seafood. ? When you prepare animal protein, cut pieces into small portion sizes. For most meat and fish, one serving is about the size of one deck of cards.  Eat at least 5 servings of fresh fruits and vegetables each day. To do this: ? Keep fruits and vegetables on hand for snacks. ? Eat 1 piece of fruit or a handful of berries with breakfast. ? Have a salad and fruit at lunch. ? Have two kinds of vegetables at dinner.  Limit foods that are high in a substance called oxalate. These include: ? Spinach. ? Rhubarb. ? Beets. ? Potato chips and french fries. ? Nuts.  If you regularly take a diuretic medicine, make sure to eat at least 1-2 fruits or vegetables high in potassium each day. These include: ? Avocado. ? Banana. ? Orange, prune, carrot, or tomato juice. ? Baked potato. ? Cabbage. ? Beans and split   peas. General instructions   Drink enough fluid to keep your urine clear or pale yellow. This is the most important thing you can do.  Talk to your health care provider and dietitian about taking daily supplements. Depending on your health and the cause of your kidney stones, you may be advised: ? Not to take supplements with vitamin C. ? To take a calcium supplement. ? To take a daily probiotic supplement. ? To take other supplements such as magnesium, fish oil, or vitamin B6.  Take all medicines and supplements as told by your health care provider.  Limit alcohol intake to no  more than 1 drink a day for nonpregnant women and 2 drinks a day for men. One drink equals 12 oz of beer, 5 oz of wine, or 1 oz of hard liquor.  Lose weight if told by your health care provider. Work with your dietitian to find strategies and an eating plan that works best for you. What foods are not recommended? Limit your intake of the following foods, or as told by your dietitian. Talk to your dietitian about specific foods you should avoid based on the type of kidney stones and your overall health. Grains Breads. Bagels. Rolls. Baked goods. Salted crackers. Cereal. Pasta. Vegetables Spinach. Rhubarb. Beets. Canned vegetables. Pickles. Olives. Meats and other protein foods Nuts. Nut butters. Large portions of meat, poultry, or fish. Salted or cured meats. Deli meats. Hot dogs. Sausages. Dairy Cheese. Beverages Regular soft drinks. Regular vegetable juice. Seasonings and other foods Seasoning blends with salt. Salad dressings. Canned soups. Soy sauce. Ketchup. Barbecue sauce. Canned pasta sauce. Casseroles. Pizza. Lasagna. Frozen meals. Potato chips. French fries. Summary  You can reduce your risk of kidney stones by making changes to your diet.  The most important thing you can do is drink enough fluid. You should drink enough fluid to keep your urine clear or pale yellow.  Ask your health care provider or dietitian how much protein from animal sources you should eat each day, and also how much salt and calcium you should have each day. This information is not intended to replace advice given to you by your health care provider. Make sure you discuss any questions you have with your health care provider. Document Revised: 09/30/2018 Document Reviewed: 05/21/2016 Elsevier Patient Education  2020 [employer]vier Inc.  

## 2020-05-25 NOTE — Progress Notes (Signed)
   05/25/2020 3:02 PM   Nettie Adelene Amas 01/23/1986 [id number]  Reason for visit: Follow up nephrolithiasis  HPI: I saw Mr. Seward Grater today in urology clinic for follow-up of nephrolithiasis.  She is a 34 year old female who underwent left-sided ureteroscopy with me in April 2021 for a left distal ureteral stone, as well as a left renal stone.  Postop renal ultrasound showed no evidence of hydronephrosis or residual stones.  Stone analysis was 100% calcium oxalate.  A 24-hour urine was completed and was notable for a low urine volume of 1.73 L, supersaturation of calcium oxalate 7.77, mildly elevated urinary oxalate of 41, normal urine calcium of 167, good urine citrate of 588, and urine sodium was normal at 129.  She is [redacted] weeks pregnant, and there is no recent imaging to review.  She denies any flank pain, gross hematuria, or urinary problems since her last visit.  We discussed general stone prevention strategies including adequate hydration with goal of producing 2.5 L of urine daily, increasing citric acid intake, increasing calcium intake during high oxalate meals, minimizing animal protein, and decreasing salt intake. Information about dietary recommendations given today.   RTC 1 year with KUB prior   Sondra Come, MD  [facility] [address], [address] [place], [place] [address] [phone]

## 2020-06-24 NOTE — L&D Delivery Note (Signed)
Delivery Note  Rebecca Owens is a [id number] at [redacted]w[redacted]d with an LMP of 01/03/2020, consistent with [place] at [redacted]w[redacted]d.   First Stage: Labor onset: 1600 10/02/2020 Augmentation: oxytocin  Analgesia Eliezer Lofts intrapartum: Epidural SROM at 0430 10/02/2020  Second Stage: Complete dilation at 0307 Onset of pushing at 0308 FHR second stage 150bpm with moderate variability. Variables with pushing with intermittent late decelerations.   Rebecca Owens presented to L&D with ROM.  She was expectantly managed and had minimal cervical change over 8 hours.  She was augmented with oxytocin and progressed to C/C/+1 with a strong urge to push.  Rebecca Owens pushed well over approximately 40 minutes.   Delivery of a viable baby girl on 10/03/2020 at 0343 by CNM Delivery of fetal head in OA position with restitution to LOT. Loose nuchal cord x 1;  Anterior then posterior shoulders delivered easily with gentle downward traction. Infant easily delivered through nuchal cord. Baby placed on mom's chest, and attended to by baby RN. Cord double clamped after cessation of pulsation, cut by father of baby.  Cord blood sample collected: N/A A Pos  Third Stage: Oxytocin bolus started after delivery of infant for hemorrhage prophylaxis  Placenta delivered intact with 3 VC @ 0352 Placenta disposition: discarded  Uterine tone firm / bleeding moderate  2nd perineal laceration identified  Anesthesia for repair: Epidural  Repair with 2-0 vicryl CT in usual fashion  Est. Blood Loss (mL): 355 ml  Complications: None  Mom to postpartum.  Baby to Couplet care / Skin to Skin.  Newborn: Information for the patient's newborn:  Rebecca Owens, Rebecca Owens [[id number]]  Live born female "Rebecca Owens"  Birth Weight:  Pending  APGAR: 7, 9  Newborn Delivery   Birth date/time: 10/03/2020 03:43:00 Delivery type: Vaginal, Spontaneous     Feeding planned: formula   ---------- Margaretmary Eddy, CNM Certified Nurse Midwife [facility]

## 2020-07-12 ENCOUNTER — Other Ambulatory Visit: Payer: Self-pay

## 2020-07-12 ENCOUNTER — Encounter
Admission: RE | Admit: 2020-07-12 | Discharge: 2020-07-12 | Disposition: A | Discharge status: Home / Self Care | Payer: 59 | Source: Ambulatory Visit | Attending: Anesthesiology | Admitting: Anesthesiology

## 2020-07-12 NOTE — Consult Note (Signed)
[facility] Anesthesia Consultation  Rebecca Owens ELF:[id number] DOB: [birth year] DOA: 07/12/2020 PCP: Garry Heater, MD   Requesting physician: Dr. Elesa Massed Date of consultation: 07/12/20 Reason for consultation: Obesity during pregnancy  CHIEF COMPLAINT:  Obesity during pregnancy  HISTORY OF PRESENT ILLNESS: Rebecca Owens  is a 35 y.o. female with a known history of obesity during pregnancy.  She has had one previous live vaginal birth for which she had an epidural without issues. She reports no other problems during this pregnancy.  PAST MEDICAL HISTORY:   Past Medical History:  Diagnosis Date  . History of kidney stones   . Kidney stone   . S/P cholecystectomy 01/2008    PAST SURGICAL HISTORY:  Past Surgical History:  Procedure Laterality Date  . CHOLECYSTECTOMY    . CYSTOSCOPY/URETEROSCOPY/HOLMIUM LASER/STENT PLACEMENT Left 10/15/2019   Procedure: CYSTOSCOPY/URETEROSCOPY/HOLMIUM LASER/STENT PLACEMENT;  Surgeon: Sondra Come, MD;  Location: [facility] ORS;  Service: Urology;  Laterality: Left;  . DILATION AND EVACUATION N/A 05/01/2018   Procedure: DILATATION AND EVACUATION;  Surgeon: Christeen Douglas, MD;  Location: [facility] ORS;  Service: Gynecology;  Laterality: N/A;    SOCIAL HISTORY:  Social History   Tobacco Use  . Smoking status: Never Smoker  . Smokeless tobacco: Never Used  Substance Use Topics  . Alcohol use: Not Currently    FAMILY HISTORY:  Family History  Problem Relation Age of Onset  . Hyperlipidemia Father   . Heart disease Paternal Grandfather     DRUG ALLERGIES:  Allergies  Allergen Reactions  . Codeine Hives    REVIEW OF SYSTEMS:   RESPIRATORY: No cough, shortness of breath, wheezing.  CARDIOVASCULAR: No chest pain, orthopnea, edema.  HEMATOLOGY: No anemia, easy bruising or bleeding SKIN: No rash or lesion. NEUROLOGIC: No tingling, numbness, weakness.  PSYCHIATRY: No anxiety or depression.   MEDICATIONS  AT HOME:  Prior to Admission medications   Medication Sig Start Date End Date Taking? Authorizing Provider  aspirin 81 MG chewable tablet Chew by mouth.    [provider]  famotidine (PEPCID) 20 MG tablet Take by mouth.    [provider]  omeprazole (PRILOSEC) 10 MG capsule Take by mouth.    [provider]  prednisoLONE acetate (PRED FORTE) 1 % ophthalmic suspension Apply to eye. 05/12/20   [provider]  Prenatal Vit-Fe Fumarate-FA (PRENATAL MULTIVITAMIN) TABS tablet Take 1 tablet by mouth at bedtime.    [provider]  pyridOXINE (VITAMIN B-6) 25 MG tablet Take by mouth.    [provider]      PHYSICAL EXAMINATION:   VITAL SIGNS: Last menstrual period 01/03/2020.  GENERAL:  35 y.o.-year-old patient no acute distress.  HEENT: Head atraumatic, normocephalic. Oropharynx and nasopharynx clear. MP 2, TM distance >3 cm, normal mouth opening. LUNGS: No use of accessory muscles of respiration.   EXTREMITIES: No pedal edema, cyanosis, or clubbing.  NEUROLOGIC: normal gait PSYCHIATRIC: The patient is alert and oriented x 3.  SKIN: No obvious rash, lesion, or ulcer.    IMPRESSION AND PLAN:   Rebecca Owens  is a 35 y.o. female presenting with obesity during pregnancy. BMI is currently 44 at [redacted] weeks gestation.    Airway exam today is reassuring and spinal interspaces are palpable.  We discussed analgesic options during labor including epidural analgesia. Discussed that in obesity there can be increased difficulty with epidural placement or even failure of successful epidural. We also discussed that even after successful epidural placement there is increased risk of  catheter migration out of the epidural space that would require catheter replacement. Discussed use of epidural vs spinal vs GA if cesarean delivery is required. Discussed increased risk of difficult intubation during pregnancy should an emergency cesarean delivery be  required.   We discussed repeat evaluation at 35-36 weeks by anesthesia to determine whether there is a high risk of complications of anesthesia for which we would recommend transfer of OB care to a facility with a higher maternal level of care designation.    Karleen Hampshire, MD

## 2020-10-02 ENCOUNTER — Inpatient Hospital Stay: Payer: 59 | Admitting: Registered Nurse

## 2020-10-02 ENCOUNTER — Other Ambulatory Visit: Payer: Self-pay

## 2020-10-02 ENCOUNTER — Inpatient Hospital Stay
Admission: EM | Admit: 2020-10-02 | Discharge: 2020-10-04 | DRG: 807 | Disposition: A | Discharge status: Home / Self Care | Payer: 59

## 2020-10-02 DIAGNOSIS — Z20822 Contact with and (suspected) exposure to covid-19: Secondary | ICD-10-CM | POA: Diagnosis present

## 2020-10-02 DIAGNOSIS — O134 Gestational [pregnancy-induced] hypertension without significant proteinuria, complicating childbirth: Secondary | ICD-10-CM | POA: Diagnosis present

## 2020-10-02 DIAGNOSIS — O99214 Obesity complicating childbirth: Secondary | ICD-10-CM | POA: Diagnosis present

## 2020-10-02 DIAGNOSIS — Z3A39 39 weeks gestation of pregnancy: Secondary | ICD-10-CM | POA: Diagnosis not present

## 2020-10-02 DIAGNOSIS — O26893 Other specified pregnancy related conditions, third trimester: Secondary | ICD-10-CM | POA: Diagnosis present

## 2020-10-02 DIAGNOSIS — O26849 Uterine size-date discrepancy, unspecified trimester: Secondary | ICD-10-CM | POA: Insufficient documentation

## 2020-10-02 DIAGNOSIS — O4292 Full-term premature rupture of membranes, unspecified as to length of time between rupture and onset of labor: Principal | ICD-10-CM | POA: Diagnosis present

## 2020-10-02 DIAGNOSIS — O4290 Premature rupture of membranes, unspecified as to length of time between rupture and onset of labor, unspecified weeks of gestation: Secondary | ICD-10-CM | POA: Diagnosis present

## 2020-10-02 DIAGNOSIS — O99213 Obesity complicating pregnancy, third trimester: Secondary | ICD-10-CM | POA: Diagnosis present

## 2020-10-02 DIAGNOSIS — O09893 Supervision of other high risk pregnancies, third trimester: Secondary | ICD-10-CM

## 2020-10-02 DIAGNOSIS — O133 Gestational [pregnancy-induced] hypertension without significant proteinuria, third trimester: Secondary | ICD-10-CM | POA: Diagnosis not present

## 2020-10-02 LAB — CBC
HCT: 32.3 % — ABNORMAL LOW (ref 36.0–46.0)
HCT: 32.1 % — ABNORMAL LOW (ref 36.0–46.0)
Hemoglobin: 11 g/dL — ABNORMAL LOW (ref 12.0–15.0)
Hemoglobin: 10.8 g/dL — ABNORMAL LOW (ref 12.0–15.0)
MCH: 30.6 pg (ref 26.0–34.0)
MCH: 30.4 pg (ref 26.0–34.0)
MCHC: 34.1 g/dL (ref 30.0–36.0)
MCHC: 33.6 g/dL (ref 30.0–36.0)
MCV: 90 fL (ref 80.0–100.0)
MCV: 90.4 fL (ref 80.0–100.0)
Platelets: 169 10*3/uL (ref 150–400)
Platelets: 159 10*3/uL (ref 150–400)
RBC: 3.59 MIL/uL — ABNORMAL LOW (ref 3.87–5.11)
RBC: 3.55 MIL/uL — ABNORMAL LOW (ref 3.87–5.11)
RDW: 13.2 % (ref 11.5–15.5)
RDW: 13.4 % (ref 11.5–15.5)
WBC: 8.3 10*3/uL (ref 4.0–10.5)
WBC: 8.6 10*3/uL (ref 4.0–10.5)
nRBC: 0 % (ref 0.0–0.2)
nRBC: 0 % (ref 0.0–0.2)

## 2020-10-02 LAB — COMPREHENSIVE METABOLIC PANEL
ALT: 25 U/L (ref 0–44)
AST: 41 U/L (ref 15–41)
Albumin: 3 g/dL — ABNORMAL LOW (ref 3.5–5.0)
Alkaline Phosphatase: 101 U/L (ref 38–126)
Anion gap: 7 (ref 5–15)
BUN: 7 mg/dL (ref 6–20)
CO2: 22 mmol/L (ref 22–32)
Calcium: 8.5 mg/dL — ABNORMAL LOW (ref 8.9–10.3)
Chloride: 108 mmol/L (ref 98–111)
Creatinine, Ser: 0.59 mg/dL (ref 0.44–1.00)
GFR, Estimated: >60 mL/min (ref >60)
Glucose, Bld: 107 mg/dL — ABNORMAL HIGH (ref 70–99)
Potassium: 3.8 mmol/L (ref 3.5–5.1)
Sodium: 137 mmol/L (ref 135–145)
Total Bilirubin: 0.3 mg/dL (ref 0.3–1.2)
Total Protein: 5.7 g/dL — ABNORMAL LOW (ref 6.5–8.1)

## 2020-10-02 LAB — PROTEIN / CREATININE RATIO, URINE
Creatinine, Urine: 49 mg/dL
Protein Creatinine Ratio: 0.24 mg/mg{Cre} — ABNORMAL HIGH (ref 0.00–0.15)
Total Protein, Urine: 12 mg/dL

## 2020-10-02 LAB — TYPE AND SCREEN
ABO/RH(D): A POS
Antibody Screen: NEGATIVE

## 2020-10-02 LAB — RESP PANEL BY RT-PCR (FLU A&B, COVID) ARPGX2
Influenza A by PCR: NEGATIVE
Influenza B by PCR: NEGATIVE
SARS Coronavirus 2 by RT PCR: NEGATIVE

## 2020-10-02 MED ORDER — MISOPROSTOL 200 MCG PO TABS
ORAL_TABLET | ORAL | Status: AC
  Filled 2020-10-02: qty 4

## 2020-10-02 MED ORDER — AMMONIA AROMATIC IN INHA
RESPIRATORY_TRACT | Status: AC
  Filled 2020-10-02: qty 10

## 2020-10-02 MED ORDER — LACTATED RINGERS IV SOLN: INTRAVENOUS | Status: DC

## 2020-10-02 MED ORDER — OXYTOCIN-SODIUM CHLORIDE 30-0.9 UT/500ML-% IV SOLN: 1.0000 m[IU]/min | INTRAVENOUS | Status: DC

## 2020-10-02 MED ORDER — OXYTOCIN 10 UNIT/ML IJ SOLN
INTRAMUSCULAR | Status: AC
  Filled 2020-10-02: qty 2

## 2020-10-02 MED ORDER — OXYTOCIN-SODIUM CHLORIDE 30-0.9 UT/500ML-% IV SOLN
2.5000 [IU]/h | INTRAVENOUS | Status: DC
  Administered 2020-10-03: 2.5 [IU]/h via INTRAVENOUS
  Filled 2020-10-02: qty 500

## 2020-10-02 MED ORDER — FENTANYL 2.5 MCG/ML W/ROPIVACAINE 0.15% IN NS 100 ML EPIDURAL (ARMC)
EPIDURAL | Status: AC
  Filled 2020-10-02: qty 100

## 2020-10-02 MED ORDER — FENTANYL CITRATE (PF) 100 MCG/2ML IJ SOLN: 50.0000 ug | INTRAMUSCULAR | Status: DC | PRN

## 2020-10-02 MED ORDER — SOD CITRATE-CITRIC ACID 500-334 MG/5ML PO SOLN: 30.0000 mL | ORAL | Status: DC | PRN

## 2020-10-02 MED ORDER — LIDOCAINE HCL (PF) 1 % IJ SOLN
30.0000 mL | INTRAMUSCULAR | Status: DC | PRN
  Filled 2020-10-02: qty 30

## 2020-10-02 MED ORDER — TERBUTALINE SULFATE 1 MG/ML IJ SOLN: 0.2500 mg | Freq: Once | INTRAMUSCULAR | Status: DC | PRN

## 2020-10-02 MED ORDER — LIDOCAINE-EPINEPHRINE (PF) 1.5 %-1:200000 IJ SOLN
INTRAMUSCULAR | Status: DC | PRN
  Administered 2020-10-02: 3 mL via PERINEURAL

## 2020-10-02 MED ORDER — ONDANSETRON HCL 4 MG/2ML IJ SOLN
4.0000 mg | Freq: Four times a day (QID) | INTRAMUSCULAR | Status: DC | PRN
  Administered 2020-10-03: 4 mg via INTRAVENOUS
  Filled 2020-10-02: qty 2

## 2020-10-02 MED ORDER — LACTATED RINGERS IV SOLN: 500.0000 mL | INTRAVENOUS | Status: DC | PRN

## 2020-10-02 MED ORDER — BUPIVACAINE HCL (PF) 0.25 % IJ SOLN
INTRAMUSCULAR | Status: DC | PRN
  Administered 2020-10-02: 4 mL via EPIDURAL
  Administered 2020-10-02: 3 mL via EPIDURAL

## 2020-10-02 MED ORDER — OXYTOCIN BOLUS FROM INFUSION
333.0000 mL | Freq: Once | INTRAVENOUS | Status: AC
  Administered 2020-10-03: 333 mL via INTRAVENOUS

## 2020-10-02 MED ORDER — ACETAMINOPHEN 325 MG PO TABS: 650.0000 mg | ORAL_TABLET | ORAL | Status: DC | PRN

## 2020-10-02 MED ORDER — OXYTOCIN-SODIUM CHLORIDE 30-0.9 UT/500ML-% IV SOLN
1.0000 m[IU]/min | INTRAVENOUS | Status: DC
  Administered 2020-10-02: 2 m[IU]/min via INTRAVENOUS
  Filled 2020-10-02: qty 500

## 2020-10-02 MED ORDER — FENTANYL 2.5 MCG/ML W/ROPIVACAINE 0.15% IN NS 100 ML EPIDURAL (ARMC)
EPIDURAL | Status: DC | PRN
  Administered 2020-10-02: 12 mL/h via EPIDURAL
  Administered 2020-10-02: 250 ug via EPIDURAL

## 2020-10-02 NOTE — Progress Notes (Signed)
Labor Progress Note  Rebecca Owens is a 35 y.o. [id number] at 109w0d by LMP admitted for rupture of membranes  Subjective: contractions feel mild, not increasing in intensity   Objective: BP 128/73   Pulse 98   Temp 97.9 F (36.6 C) (Oral)   Resp 18   Ht 5\' 5"  (1.651 m)   Wt 136.1 kg   LMP 01/03/2020   BMI 49.92 kg/m  Notable VS details: reviewed   Fetal Assessment: FHT:  FHR: 135 bpm, variability: moderate,  accelerations:  Present,  decelerations:  Absent Category/reactivity:  Category I UC:   Irregular, mild  SVE:   3/50/-2/soft/mid Membrane status: SROM at 0430 Amniotic color: Clear   Labs: Lab Results  Component Value Date   WBC 8.3 10/02/2020   HGB 11.0 (L) 10/02/2020   HCT 32.3 (L) 10/02/2020   MCV 90.0 10/02/2020   PLT 169 10/02/2020    Assessment / Plan: Minimal change over past 5 hours  Discussed augmentation of labor d/t minimal change.  Reviewed risk and benefits.  Consents to augmentation with oxytocin.   Labor: PROM, no active labor, will start augmention  Preeclampsia:  no signs or symptoms of toxicity Fetal Wellbeing:  Category I Pain Control:  Labor support without medications.  Desires epidural with initiation of augmentation.  I/D:  ROM x 8 hours, afebrile Anticipated MOD:  NSVD  12/02/2020, CNM 10/02/2020, 12:24 PM

## 2020-10-02 NOTE — Anesthesia Preprocedure Evaluation (Signed)
Anesthesia Evaluation  Patient identified by MRN, date of birth, ID band Patient awake    Reviewed: Allergy & Precautions, H&P , NPO status , Patient's Chart, lab work & pertinent test results  History of Anesthesia Complications (+) PONV and history of anesthetic complications  Airway Mallampati: II  TM Distance: >3 FB Neck ROM: full    Dental no notable dental hx.    Pulmonary neg pulmonary ROS,    Pulmonary exam normal        Cardiovascular negative cardio ROS Normal cardiovascular exam     Neuro/Psych  Neuromuscular disease negative psych ROS   GI/Hepatic negative GI ROS, Neg liver ROS,   Endo/Other  negative endocrine ROS  Renal/GU Renal diseaseKidney stones  negative genitourinary   Musculoskeletal   Abdominal   Peds  Hematology negative hematology ROS (+)   Anesthesia Other Findings Past Medical History: 10/12/2019: Abnormal uterine bleeding (AUB) No date: History of kidney stones 09/09/2017: Influenza A No date: Kidney stone 07/09/2019: Recurrent pregnancy loss 01/2008: S/P cholecystectomy  Reproductive/Obstetrics (+) Pregnancy                             Anesthesia Physical Anesthesia Plan  ASA: III  Anesthesia Plan: Epidural   Post-op Pain Management:    Induction:   PONV Risk Score and Plan:   Airway Management Planned:   Additional Equipment:   Intra-op Plan:   Post-operative Plan:   Informed Consent: I have reviewed the patients History and Physical, chart, labs and discussed the procedure including the risks, benefits and alternatives for the proposed anesthesia with the patient or authorized representative who has indicated his/her understanding and acceptance.       Plan Discussed with: Anesthesiologist  Anesthesia Plan Comments:         Anesthesia Quick Evaluation

## 2020-10-02 NOTE — Anesthesia Procedure Notes (Addendum)
Epidural Patient location during procedure: OB Start time: 10/02/2020 3:34 PM End time: 10/02/2020 3:42 PM  Staffing Anesthesiologist: Yevette Edwards, MD Resident/CRNA: Ginger Carne, CRNA Performed: resident/CRNA   Preanesthetic Checklist Completed: patient identified, IV checked, site marked, risks and benefits discussed, surgical consent, monitors and equipment checked, pre-op evaluation and timeout performed  Epidural Patient position: sitting Prep: ChloraPrep Patient monitoring: heart rate, continuous pulse ox and blood pressure Approach: midline Location: L3-L4 Injection technique: LOR saline  Needle:  Needle type: Tuohy  Needle gauge: 17 G Needle length: 9 cm and 9 Catheter type: closed end flexible Catheter size: 19 Gauge Catheter at skin depth: 12 cm Test dose: negative and 1.5% lidocaine with Epi 1:200 K  Assessment Sensory level: T10 Events: blood not aspirated, injection not painful, no injection resistance, no paresthesia and negative IV test  Additional Notes 2 attempt Pt. Evaluated and documentation done after procedure finished. Patient identified. Risks/Benefits/Options discussed with patient including but not limited to bleeding, infection, nerve damage, paralysis, failed block, incomplete pain control, headache, blood pressure changes, nausea, vomiting, reactions to medication both or allergic, itching and postpartum back pain. Confirmed with bedside nurse the patient's most recent platelet count. Confirmed with patient that they are not currently taking any anticoagulation, have any bleeding history or any family history of bleeding disorders. Patient expressed understanding and wished to proceed. All questions were answered. Sterile technique was used throughout the entire procedure. Please see nursing notes for vital signs. Test dose was given through epidural catheter and negative prior to continuing to dose epidural or start infusion. Warning signs of  high block given to the patient including shortness of breath, tingling/numbness in hands, complete motor block, or any concerning symptoms with instructions to call for help. Patient was given instructions on fall risk and not to get out of bed. All questions and concerns addressed with instructions to call with any issues or inadequate analgesia.   Patient tolerated the insertion well without immediate complications.Reason for block:procedure for pain

## 2020-10-02 NOTE — OB Triage Note (Signed)
Pt presented to L/D with reported contractions that began at 1830 last night, rated 4/10, and SROM at 0430 this morning. Pt reports clear fluid and positive fetal movement. Bloody show noted. CNM at bedside-confirmed SROM.  Monitors applied and assessing- fht 155. VSS.  Pt to be admitted to L/D.

## 2020-10-02 NOTE — H&P (Signed)
OB History & Physical   History of Present Illness:  Chief Complaint: leaking fluid at 0430  HPI:  Rebecca Owens is a 35 y.o. [id number] female at [redacted]w[redacted]d dated by LMP and [place] at [redacted]w[redacted]d.  She presents to L&D for leaking fluid at 0430- small amounts clear fluid;  irregular contractions, slightly more intense but q8-58min. Having intermittent bloody show- brown to red.      Pregnancy Issues: 1. 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 Obesity, BMI 44    Maternal Medical History:   Past Medical History:  Diagnosis Date  . History of kidney stones   . Kidney stone   . S/P cholecystectomy 01/2008    Past Surgical History:  Procedure Laterality Date  . CHOLECYSTECTOMY    . CYSTOSCOPY/URETEROSCOPY/HOLMIUM LASER/STENT PLACEMENT Left 10/15/2019   Procedure: CYSTOSCOPY/URETEROSCOPY/HOLMIUM LASER/STENT PLACEMENT;  Surgeon: Sondra Come, MD;  Location: [facility] ORS;  Service: Urology;  Laterality: Left;  . DILATION AND EVACUATION N/A 05/01/2018   Procedure: DILATATION AND EVACUATION;  Surgeon: Christeen Douglas, MD;  Location: [facility] ORS;  Service: Gynecology;  Laterality: N/A;    Allergies  Allergen Reactions  . Codeine Hives    Prior to Admission medications   Medication Sig Start Date End Date Taking? Authorizing Provider  aspirin 81 MG chewable tablet Chew by mouth.   Yes [provider]  cetirizine (ZYRTEC) 10 MG tablet Take 10 mg by mouth daily.   Yes [provider]  famotidine (PEPCID) 20 MG tablet Take by mouth.   Yes [provider]  omeprazole (PRILOSEC) 10 MG capsule Take by mouth.   Yes [provider]  Prenatal Vit-Fe Fumarate-FA (PRENATAL MULTIVITAMIN) TABS tablet Take 1 tablet by mouth at bedtime.   Yes [provider]  pyridOXINE (VITAMIN B-6) 25 MG tablet Take by mouth.   Yes [provider]  prednisoLONE acetate (PRED FORTE)  1 % ophthalmic suspension Apply to eye. Patient not taking: Reported on 10/02/2020 05/12/20   [provider]     Prenatal care site: [facility]    Social History: She  reports that she has never smoked. She has never used smokeless tobacco. She reports previous alcohol use. She reports that she does not use drugs.  Family History: family history includes Heart disease in her paternal grandfather; Hyperlipidemia in her father.   Review of Systems: A full review of systems was performed and negative except as noted in the HPI.     Physical Exam:  Vital Signs: Temp 98 F (36.7 C) (Oral)   Ht 5\' 5"  (1.651 m)   Wt 136.1 kg   LMP 01/03/2020   BMI 49.92 kg/m    General: no acute distress.  HEENT: normocephalic, atraumatic Heart: regular rate & rhythm.  No murmurs/rubs/gallops Lungs: clear to auscultation bilaterally, normal respiratory effort Abdomen: soft, gravid, non-tender;  EFW: 8lbs Pelvic:   External: Normal external female genitalia  Cervix: Dilation: 3 / Effacement (%): 50 / Station: -3    Extremities: non-tender, symmetric, no edema bilaterally.  DTRs: 2+  Neurologic: Alert & oriented x 3.    No results found for this or any previous visit (from the past 24 hour(s)).  Pertinent Results:  Prenatal Labs: Blood type/Rh  A Pos  Antibody screen neg  Rubella Immune  Varicella Immune  RPR NR  HBsAg Neg  HIV NR  GC neg  Chlamydia neg  Genetic screening Negative cffDNA.  Declined AFP  1 hour GTT  147  3 hour GTT  [phone]  GBS  neg   FHT: 150bpm, mod variability, + accels, no decels TOCO: q20min SVE:  Dilation: 3 / Effacement (%): 50 / Station: -3    Cephalic by leopolds  No results found.  Assessment:  Rebecca Owens is a 35 y.o. G13 P1-0-11-1 female at [redacted]w[redacted]d with ruptured membranes, early labor.   Plan:  1. Admit to Labor & Delivery; consents reviewed and obtained - COVID swab on admit.   2. Fetal Well being  - Fetal Tracing: Cat  I tracing - Group B Streptococcus ppx indicated: negative - Presentation: cephalic confirmed by exam   3. Routine OB: - Prenatal labs reviewed, as above - Rh A Pos - CBC, T&S, RPR on admit - Clear fluids, IVF  4. Monitoring of Labor -  Contractions: external toco in place -  Pelvis proven to 7#6 -  Plan for augmentation if labor does not progress spontaneously.  -  Plan for continuous fetal monitoring  -  Maternal pain control as desired - Anticipate vaginal delivery  5. Post Partum Planning: - Infant feeding: formula - Contraception: OCPs, consider vasectomy - Tdap: 07/19/20 - Flu: 03/30/20  Prudencio Pair Ramanda Paules, CNM 10/02/20 7:45 AM

## 2020-10-02 NOTE — Progress Notes (Signed)
Labor Progress Note  Rebecca Owens is a 35 y.o. [id number] at [redacted]w[redacted]d by LMP admitted for rupture of membranes  Subjective: feeling more comfortable after epidural  Objective: BP 123/83   Pulse 85   Temp 97.6 F (36.4 C) (Oral)   Resp 18   Ht 5\' 5"  (1.651 m)   Wt 136.1 kg   LMP 01/03/2020   SpO2 97%   BMI 49.92 kg/m  Notable VS details: reviewed  -Several elevated blood pressures noted after epidural  -B/P's have been WNL since  Fetal Assessment: FHT:  FHR: 135 bpm, variability: moderate,  accelerations:  Present,  decelerations:  Absent Category/reactivity:  Category I UC:   regular, every 4-5 minutes - IUPC placed  SVE:   4/70/-2/soft/ant Membrane status: SROM at 0430 Amniotic color: Clear   Labs: Lab Results  Component Value Date   WBC 8.6 10/02/2020   HGB 10.8 (L) 10/02/2020   HCT 32.1 (L) 10/02/2020   MCV 90.4 10/02/2020   PLT 159 10/02/2020    Assessment / Plan: Augmentation of labor  -Minimal cervical change over the past 2 hours -IUPC placed for better assessment of contractions -Pitocin infusing at 18 milliunits/min  Labor: Early labor  Preeclampsia:  no signs or symptoms of toxicity Fetal Wellbeing:  Category I Pain Control:  Epidural I/D:  ROM x 16 hours, afebrile, GBS neg Anticipated MOD:  NSVD  12/02/2020, CNM 10/02/2020, 7:59 PM

## 2020-10-03 ENCOUNTER — Encounter: Payer: Self-pay | Admitting: Obstetrics and Gynecology

## 2020-10-03 DIAGNOSIS — O133 Gestational [pregnancy-induced] hypertension without significant proteinuria, third trimester: Secondary | ICD-10-CM | POA: Diagnosis not present

## 2020-10-03 LAB — RPR: RPR Ser Ql: NONREACTIVE

## 2020-10-03 MED ORDER — PANTOPRAZOLE SODIUM 40 MG PO TBEC
40.0000 mg | DELAYED_RELEASE_TABLET | Freq: Every day | ORAL | Status: DC
  Administered 2020-10-03: 40 mg via ORAL
  Filled 2020-10-03: qty 1

## 2020-10-03 MED ORDER — ACETAMINOPHEN 500 MG PO TABS
1000.0000 mg | ORAL_TABLET | Freq: Four times a day (QID) | ORAL | Status: DC | PRN
  Administered 2020-10-03 (×2): 1000 mg via ORAL
  Filled 2020-10-03 (×2): qty 2

## 2020-10-03 MED ORDER — FAMOTIDINE 20 MG PO TABS: 10.0000 mg | ORAL_TABLET | Freq: Two times a day (BID) | ORAL | Status: DC | PRN

## 2020-10-03 MED ORDER — COCONUT OIL OIL: 1.0000 "application " | TOPICAL_OIL | Status: DC | PRN

## 2020-10-03 MED ORDER — ONDANSETRON HCL 4 MG PO TABS: 4.0000 mg | ORAL_TABLET | ORAL | Status: DC | PRN

## 2020-10-03 MED ORDER — ONDANSETRON HCL 4 MG/2ML IJ SOLN: 4.0000 mg | INTRAMUSCULAR | Status: DC | PRN

## 2020-10-03 MED ORDER — DIPHENHYDRAMINE HCL 25 MG PO CAPS: 25.0000 mg | ORAL_CAPSULE | Freq: Four times a day (QID) | ORAL | Status: DC | PRN

## 2020-10-03 MED ORDER — WITCH HAZEL-GLYCERIN EX PADS: 1.0000 "application " | MEDICATED_PAD | CUTANEOUS | Status: DC

## 2020-10-03 MED ORDER — DOCUSATE SODIUM 100 MG PO CAPS: 100.0000 mg | ORAL_CAPSULE | Freq: Two times a day (BID) | ORAL | Status: DC

## 2020-10-03 MED ORDER — DIBUCAINE (PERIANAL) 1 % EX OINT: 1.0000 "application " | TOPICAL_OINTMENT | CUTANEOUS | Status: DC | PRN

## 2020-10-03 MED ORDER — IBUPROFEN 600 MG PO TABS
600.0000 mg | ORAL_TABLET | Freq: Four times a day (QID) | ORAL | Status: DC
  Administered 2020-10-03 (×3): 600 mg via ORAL
  Filled 2020-10-03 (×3): qty 1

## 2020-10-03 MED ORDER — PRENATAL MULTIVITAMIN CH
1.0000 | ORAL_TABLET | Freq: Every day | ORAL | Status: DC
  Administered 2020-10-03: 1 via ORAL
  Filled 2020-10-03: qty 1

## 2020-10-03 MED ORDER — SIMETHICONE 80 MG PO CHEW: 80.0000 mg | CHEWABLE_TABLET | ORAL | Status: DC | PRN

## 2020-10-03 MED ORDER — BENZOCAINE-MENTHOL 20-0.5 % EX AERO
1.0000 "application " | INHALATION_SPRAY | CUTANEOUS | Status: DC | PRN
  Administered 2020-10-03: 1 via TOPICAL
  Filled 2020-10-03: qty 56

## 2020-10-03 NOTE — Anesthesia Postprocedure Evaluation (Signed)
Anesthesia Post Note  Patient: [facility]  Procedure(s) Performed: AN AD HOC LABOR EPIDURAL  Patient location during evaluation: Mother Baby Anesthesia Type: Epidural Level of consciousness: awake and alert Pain management: pain level controlled Vital Signs Assessment: post-procedure vital signs reviewed and stable Respiratory status: spontaneous breathing, nonlabored ventilation and respiratory function stable Cardiovascular status: stable Postop Assessment: no headache, no backache and epidural receding Anesthetic complications: no   No complications documented.   Last Vitals:  Vitals:   10/03/20 0610 10/03/20 0718  BP: 114/61 121/67  Pulse: (!) 121 75  Resp:  14  Temp:  36.7 C  SpO2:      Last Pain:  Vitals:   10/03/20 0718  TempSrc: Oral  PainSc:                  Elmarie Mainland

## 2020-10-03 NOTE — Discharge Summary (Signed)
Obstetrical Discharge Summary  Patient Name: Rebecca Owens [facility] DOB: Nov 04, 1985 MRN: [id number]  Date of Admission: 10/02/2020 Date of Delivery: 10/03/2020 Delivered by: Margaretmary Eddy, CNM  Date of Discharge: 10/04/2020  Primary OB: [facility] [id number] last menstrual period was 01/03/2020. EDC Estimated Date of Delivery: 10/09/20 Gestational Age at Delivery: [redacted]w[redacted]d   Antepartum complications:  1. 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 Obesity, BMI 44  3. Gestational hypertension - several mild range blood pressures noted in labor   Admitting Diagnosis: PROM Secondary Diagnosis: Patient Active Problem List   Diagnosis Date Noted  . Gestational hypertension without significant proteinuria during pregnancy in third trimester, antepartum 10/03/2020  . Normal labor 10/02/2020  . Premature rupture of membranes 10/02/2020  . Obesity in pregnancy, antepartum, third trimester 10/02/2020  . Uterine size date discrepancy, antepartum 10/02/2020  . Supervision of other high risk pregnancies, third trimester 04/03/2020  . Lumbar radiculopathy 11/29/2019  . Family history of genetic disease 07/09/2019  . Acute midline thoracic back pain 10/08/2018  . 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  She was expectantly managed and had minimal cervical change over 8 hours.  She was augmented with oxytocin and progressed to C/C/+1 with a strong urge to push.  Rebecca Owens pushed well over approximately 40 minutes.  Delivery Type: spontaneous vaginal delivery Anesthesia: epidural Placenta: spontaneous Laceration: 2nd degree perineal, repair with 2-0 vicryl CT  Episiotomy: none Newborn Data: Live born female  Birth Weight:  8#3 APGAR: 7, 9  Newborn Delivery   Birth date/time:  10/03/2020 03:43:00 Delivery type: Vaginal, Spontaneous     Postpartum Procedures: none  Edinburgh:  Edinburgh Postnatal Depression Scale Screening Tool 10/03/2020  I have been able to laugh and see the funny side of things. 0  I have looked forward with enjoyment to things. 0  I have blamed myself unnecessarily when things went wrong. 1  I have been anxious or worried for no good reason. 0  I have felt scared or panicky for no good reason. 1  Things have been getting on top of me. 0  I have been so unhappy that I have had difficulty sleeping. 0  I have felt sad or miserable. 0  I have been so unhappy that I have been crying. 0  The thought of harming myself has occurred to me. 0  Edinburgh Postnatal Depression Scale Total 2     Post partum course:  Patient had an uncomplicated postpartum course.  Rebecca Owens had labile blood pressures in labor with several mild range blood pressures noted.  Blood pressures were normal postpartum.  By time of discharge on PPD#1, her pain was controlled on oral pain medications; she had appropriate lochia and was ambulating, voiding without difficulty and tolerating regular diet.  She was deemed stable for discharge to home.    Discharge Physical Exam:  BP 112/64 (BP Location: Left Arm)   Pulse 89   Temp 98 F (36.7 C)   Resp 18   Ht 5\' 5"  (1.651 m)   Wt 136.1 kg   LMP 01/03/2020   SpO2 98%   Breastfeeding Unknown   BMI 49.92 kg/m   General: NAD CV: RRR Pulm: CTABL, nl effort ABD: s/nd/nt, fundus firm and below the umbilicus Lochia: moderate Perineum:minimal edema/repair well approximated DVT Evaluation: LE non-ttp, no evidence of DVT on  exam.  Hemoglobin  Date Value Ref Range Status  10/04/2020 10.3 (L) 12.0 - 15.0 g/dL Final   HGB  Date Value Ref Range Status  09/25/2013 13.1 12.0 - 16.0 g/dL Final   HCT  Date Value Ref Range Status  10/04/2020 30.5 (L) 36.0 - 46.0 % Final  09/25/2013 38.7 35.0 - 47.0 % Final     Disposition:  stable, discharge to home. Baby Feeding: formula Baby Disposition: home with mom  Rh Immune globulin given: Rh pos Rubella vaccine given: Immune Varivax vaccine given: Immune Flu vaccine given in AP or PP setting: Given 03/30/2020 Tdap vaccine given in AP or PP setting: given 07/19/2020  Contraception: OCP's  Prenatal Labs:  Blood type/Rh  A Pos  Antibody screen neg  Rubella Immune  Varicella Immune  RPR NR  HBsAg Neg  HIV NR  GC neg  Chlamydia neg  Genetic screening Negative cffDNA. Declined AFP  1 hour GTT  147  3 hour GTT  [phone]  GBS  neg    Plan:  [name] was discharged to home in good condition. Follow-up appointment with delivering provider in 6 weeks.  Discharge Medications: Allergies as of 10/04/2020      Reactions   Codeine Hives      Medication List    TAKE these medications   acetaminophen 500 MG tablet Commonly known as: TYLENOL Take 2 tablets (1,000 mg total) by mouth every 6 (six) hours as needed (for pain scale < 4).   benzocaine-Menthol 20-0.5 % Aero Commonly known as: DERMOPLAST Apply 1 application topically as needed for irritation (perineal discomfort).   cetirizine 10 MG tablet Commonly known as: ZYRTEC Take 10 mg by mouth daily.   coconut oil Oil Apply 1 application topically as needed.   docusate sodium 100 MG capsule Commonly known as: COLACE Take 1 capsule (100 mg total) by mouth 2 (two) times daily.   famotidine 20 MG tablet Commonly known as: PEPCID Take by mouth.   ibuprofen 600 MG tablet Commonly known as: ADVIL Take 1 tablet (600 mg total) by mouth every 6 (six) hours.   omeprazole 10 MG capsule Commonly known as: PRILOSEC Take by mouth.   prenatal multivitamin Tabs tablet Take 1 tablet by mouth at bedtime.   witch hazel-glycerin pad Commonly known as: TUCKS Apply 1 application topically continuous.        Follow-up Information    Gustavo Lah, CNM. Schedule an appointment as soon as possible  for a visit in 6 week(s).   Specialty: Certified Nurse Midwife Why: postpartum visit  Contact information: [address] [place] [address] [phone]               Signed: Randa Ngo, CNM 10/04/2020 9:16 AM

## 2020-10-03 NOTE — Progress Notes (Signed)
Labor Progress Note  Rebecca Owens is a 35 y.o. [id number] at [redacted]w[redacted]d by LMP admitted for PROM  Subjective: feeling more pressure with contractions   Objective: BP (!) 143/55   Pulse 93   Temp 97.6 F (36.4 C) (Oral)   Resp 16   Ht 5\' 5"  (1.651 m)   Wt 136.1 kg   LMP 01/03/2020   SpO2 96%   BMI 49.92 kg/m  Notable VS details: reviewed, intermittent mild range blood pressures   Fetal Assessment: FHT:  FHR: 140 bpm, variability: moderate,  accelerations:  Present,  decelerations:  Absent Category/reactivity:  Category I UC:   regular, every 2-3 minutes, MVU 260 SVE:   9.5/100/0-+1 Membrane status: SROM at 0430 Amniotic color: Clear   Labs: Lab Results  Component Value Date   WBC 8.6 10/02/2020   HGB 10.8 (L) 10/02/2020   HCT 32.1 (L) 10/02/2020   MCV 90.4 10/02/2020   PLT 159 10/02/2020    Assessment / Plan: Augmentation of labor, progressing well -Persistent anterior lip with pushing.  Attempted to reduce but cervix is felt over the anterior portion of fetal head while pushing.  -Will continue with positioning until completely dilated   Labor: Progressing normally Preeclampsia:  no signs or symptoms of toxicity Fetal Wellbeing:  Category I Pain Control:  Epidural I/D:  ROM x 22 hours, afebrile, GBS neg Anticipated MOD:  NSVD  12/02/2020, CNM 10/03/2020, 2:00 AM

## 2020-10-03 NOTE — Discharge Instructions (Signed)
Vaginal Delivery, Care After Refer to this sheet in the next few weeks. These discharge instructions provide you with information on caring for yourself after delivery. Your caregiver may also give you specific instructions. Your treatment has been planned according to the most current medical practices available, but problems sometimes occur. Call your caregiver if you have any problems or questions after you go home. HOME CARE INSTRUCTIONS 1. Take over-the-counter or prescription medicines only as directed by your caregiver or pharmacist. 2. Do not drink alcohol, especially if you are breastfeeding or taking medicine to relieve pain. 3. Do not smoke tobacco. 4. Continue to use good perineal care. Good perineal care includes: 1. Wiping your perineum from back to front 2. Keeping your perineum clean. 3. You can do sitz baths twice a day, to help keep this area clean 5. Do not use tampons, douche or have sex until your caregiver says it is okay. 6. Shower only and avoid sitting in submerged water, aside from sitz baths 7. Wear a well-fitting bra that provides breast support. 8. Eat healthy foods. 9. Drink enough fluids to keep your urine clear or pale yellow. 10. Eat high-fiber foods such as whole grain cereals and breads, brown rice, beans, and fresh fruits and vegetables every day. These foods may help prevent or relieve constipation. 11. Avoid constipation with high fiber foods or medications, such as miralax or metamucil 12. Follow your caregiver's recommendations regarding resumption of activities such as climbing stairs, driving, lifting, exercising, or traveling. 13. Talk to your caregiver about resuming sexual activities. Resumption of sexual activities is dependent upon your risk of infection, your rate of healing, and your comfort and desire to resume sexual activity. 14. Try to have someone help you with your household activities and your newborn for at least a few days after you leave  the hospital. 15. Rest as much as possible. Try to rest or take a nap when your newborn is sleeping. 16. Increase your activities gradually. 17. Keep all of your scheduled postpartum appointments. It is very important to keep your scheduled follow-up appointments. At these appointments, your caregiver will be checking to make sure that you are healing physically and emotionally. SEEK MEDICAL CARE IF:   You are passing large clots from your vagina. Save any clots to show your caregiver.  You have a foul smelling discharge from your vagina.  You have trouble urinating.  You are urinating frequently.  You have pain when you urinate.  You have a change in your bowel movements.  You have increasing redness, pain, or swelling near your vaginal incision (episiotomy) or vaginal tear.  You have pus draining from your episiotomy or vaginal tear.  Your episiotomy or vaginal tear is separating.  You have painful, hard, or reddened breasts.  You have a severe headache.  You have blurred vision or see spots.  You feel sad or depressed.  You have thoughts of hurting yourself or your newborn.  You have questions about your care, the care of your newborn, or medicines.  You are dizzy or light-headed.  You have a rash.  You have nausea or vomiting.  You were breastfeeding and have not had a menstrual period within 12 weeks after you stopped breastfeeding.  You are not breastfeeding and have not had a menstrual period by the 12th week after delivery.  You have a fever. SEEK IMMEDIATE MEDICAL CARE IF:   You have persistent pain.  You have chest pain.  You have shortness of breath.    You faint.  You have leg pain.  You have stomach pain.  Your vaginal bleeding saturates two or more sanitary pads in 1 hour. MAKE SURE YOU:   Understand these instructions.  Will watch your condition.  Will get help right away if you are not doing well or get worse. Document Released:  06/07/2000 Document Revised: 10/25/2013 Document Reviewed: 02/05/2012 ExitCare Patient Information 2015 [employer]. This information is not intended to replace advice given to you by your health care provider. Make sure you discuss any questions you have with your health care provider.  Sitz Bath A sitz bath is a warm water bath taken in the sitting position. The water covers only the hips and butt (buttocks). We recommend using one that fits in the toilet, to help with ease of use and cleanliness. It may be used for either healing or cleaning purposes. Sitz baths are also used to relieve pain, itching, or muscle tightening (spasms). The water may contain medicine. Moist heat will help you heal and relax.  HOME CARE  Take 3 to 4 sitz baths a day. 18. Fill the bathtub half-full with warm water. 19. Sit in the water and open the drain a little. 20. Turn on the warm water to keep the tub half-full. Keep the water running constantly. 21. Soak in the water for 15 to 20 minutes. 22. After the sitz bath, pat the affected area dry. GET HELP RIGHT AWAY IF: You get worse instead of better. Stop the sitz baths if you get worse. MAKE SURE YOU:  Understand these instructions.  Will watch your condition.  Will get help right away if you are not doing well or get worse. Document Released: 07/18/2004 Document Revised: 03/04/2012 Document Reviewed: 10/08/2010 ExitCare Patient Information 2015 [employer]. This information is not intended to replace advice given to you by your health care provider. Make sure you discuss any questions you have with your health care provider.    

## 2020-10-03 NOTE — Progress Notes (Signed)
Pt stated "I feel like I haven't been able to urinate fully." Pt able to urinate . Bladder scan post void . Paged Oxley CNM, New orders placed for in and out cath. output, 12mL residual.

## 2020-10-04 LAB — CBC
HCT: 30.5 % — ABNORMAL LOW (ref 36.0–46.0)
Hemoglobin: 10.3 g/dL — ABNORMAL LOW (ref 12.0–15.0)
MCH: 31 pg (ref 26.0–34.0)
MCHC: 33.8 g/dL (ref 30.0–36.0)
MCV: 91.9 fL (ref 80.0–100.0)
Platelets: 155 10*3/uL (ref 150–400)
RBC: 3.32 MIL/uL — ABNORMAL LOW (ref 3.87–5.11)
RDW: 13.8 % (ref 11.5–15.5)
WBC: 11 10*3/uL — ABNORMAL HIGH (ref 4.0–10.5)
nRBC: 0 % (ref 0.0–0.2)

## 2020-10-04 MED ORDER — IBUPROFEN 600 MG PO TABS: 600.0000 mg | ORAL_TABLET | Freq: Four times a day (QID) | ORAL | 0 refills | Status: DC

## 2020-10-04 MED ORDER — DOCUSATE SODIUM 100 MG PO CAPS: 100.0000 mg | ORAL_CAPSULE | Freq: Two times a day (BID) | ORAL | 0 refills | Status: DC

## 2020-10-04 MED ORDER — ACETAMINOPHEN 500 MG PO TABS: 1000.0000 mg | ORAL_TABLET | Freq: Four times a day (QID) | ORAL | 0 refills | Status: DC | PRN

## 2020-10-04 MED ORDER — COCONUT OIL OIL: 1.0000 "application " | TOPICAL_OIL | 0 refills | Status: DC | PRN

## 2020-10-04 MED ORDER — BENZOCAINE-MENTHOL 20-0.5 % EX AERO: 1.0000 "application " | INHALATION_SPRAY | CUTANEOUS | Status: DC | PRN

## 2020-10-04 MED ORDER — WITCH HAZEL-GLYCERIN EX PADS: 1.0000 "application " | MEDICATED_PAD | CUTANEOUS | 12 refills | Status: DC

## 2020-10-04 NOTE — Plan of Care (Signed)
Mother assessed by me and Midwife and cleared for discharge. Mother given discharge education and verbalizes understanding including when to call her doctor for concerns.

## 2021-04-08 ENCOUNTER — Other Ambulatory Visit: Payer: Self-pay

## 2021-04-08 ENCOUNTER — Emergency Department: Payer: 59

## 2021-04-08 ENCOUNTER — Emergency Department
Admission: EM | Admit: 2021-04-08 | Discharge: 2021-04-08 | Disposition: A | Discharge status: Home / Self Care | Payer: 59 | Attending: Emergency Medicine | Admitting: Emergency Medicine

## 2021-04-08 DIAGNOSIS — N200 Calculus of kidney: Secondary | ICD-10-CM

## 2021-04-08 DIAGNOSIS — N132 Hydronephrosis with renal and ureteral calculous obstruction: Secondary | ICD-10-CM | POA: Insufficient documentation

## 2021-04-08 DIAGNOSIS — R109 Unspecified abdominal pain: Secondary | ICD-10-CM | POA: Diagnosis present

## 2021-04-08 LAB — URINALYSIS, COMPLETE (UACMP) WITH MICROSCOPIC
Bacteria, UA: NONE SEEN
Bilirubin Urine: NEGATIVE
Glucose, UA: NEGATIVE mg/dL
Hgb urine dipstick: NEGATIVE
Ketones, ur: NEGATIVE mg/dL
Leukocytes,Ua: NEGATIVE
Nitrite: NEGATIVE
Protein, ur: NEGATIVE mg/dL
Specific Gravity, Urine: 1.009 (ref 1.005–1.030)
pH: 7 (ref 5.0–8.0)

## 2021-04-08 LAB — POC URINE PREG, ED: Preg Test, Ur: NEGATIVE

## 2021-04-08 LAB — BASIC METABOLIC PANEL
Anion gap: 8 (ref 5–15)
BUN: 17 mg/dL (ref 6–20)
CO2: 27 mmol/L (ref 22–32)
Calcium: 9.3 mg/dL (ref 8.9–10.3)
Chloride: 103 mmol/L (ref 98–111)
Creatinine, Ser: 1.09 mg/dL — ABNORMAL HIGH (ref 0.44–1.00)
GFR, Estimated: >60 mL/min (ref >60)
Glucose, Bld: 102 mg/dL — ABNORMAL HIGH (ref 70–99)
Potassium: 4.1 mmol/L (ref 3.5–5.1)
Sodium: 138 mmol/L (ref 135–145)

## 2021-04-08 LAB — CBC
HCT: 39 % (ref 36.0–46.0)
Hemoglobin: 13.7 g/dL (ref 12.0–15.0)
MCH: 31.6 pg (ref 26.0–34.0)
MCHC: 35.1 g/dL (ref 30.0–36.0)
MCV: 90.1 fL (ref 80.0–100.0)
Platelets: 221 10*3/uL (ref 150–400)
RBC: 4.33 MIL/uL (ref 3.87–5.11)
RDW: 11.8 % (ref 11.5–15.5)
WBC: 8.2 10*3/uL (ref 4.0–10.5)
nRBC: 0 % (ref 0.0–0.2)

## 2021-04-08 MED ORDER — HYDROMORPHONE HCL 1 MG/ML IJ SOLN
1.0000 mg | Freq: Once | INTRAMUSCULAR | Status: AC
  Administered 2021-04-08: 1 mg via INTRAVENOUS
  Filled 2021-04-08: qty 1

## 2021-04-08 MED ORDER — OXYCODONE-ACETAMINOPHEN 5-325 MG PO TABS: 1.0000 | ORAL_TABLET | Freq: Three times a day (TID) | ORAL | 0 refills | Status: AC | PRN

## 2021-04-08 MED ORDER — TAMSULOSIN HCL 0.4 MG PO CAPS
0.4000 mg | ORAL_CAPSULE | Freq: Once | ORAL | Status: AC
  Administered 2021-04-08: 0.4 mg via ORAL
  Filled 2021-04-08: qty 1

## 2021-04-08 MED ORDER — METOCLOPRAMIDE HCL 5 MG/ML IJ SOLN
10.0000 mg | Freq: Once | INTRAMUSCULAR | Status: AC
  Administered 2021-04-08: 10 mg via INTRAVENOUS
  Filled 2021-04-08: qty 2

## 2021-04-08 MED ORDER — SODIUM CHLORIDE 0.9 % IV BOLUS
1000.0000 mL | Freq: Once | INTRAVENOUS | Status: AC
  Administered 2021-04-08: 1000 mL via INTRAVENOUS

## 2021-04-08 MED ORDER — TAMSULOSIN HCL 0.4 MG PO CAPS: 0.4000 mg | ORAL_CAPSULE | Freq: Every day | ORAL | 0 refills | Status: AC

## 2021-04-08 MED ORDER — ONDANSETRON 4 MG PO TBDP
4.0000 mg | ORAL_TABLET | Freq: Once | ORAL | Status: AC
  Administered 2021-04-08: 4 mg via ORAL
  Filled 2021-04-08: qty 1

## 2021-04-08 MED ORDER — OXYCODONE-ACETAMINOPHEN 5-325 MG PO TABS
1.0000 | ORAL_TABLET | Freq: Once | ORAL | Status: AC
  Administered 2021-04-08: 1 via ORAL
  Filled 2021-04-08: qty 1

## 2021-04-08 MED ORDER — ONDANSETRON 4 MG PO TBDP: 4.0000 mg | ORAL_TABLET | Freq: Three times a day (TID) | ORAL | 0 refills | Status: DC | PRN

## 2021-04-08 NOTE — ED Notes (Signed)
Dc instructions and scripts reviewed with pt no questions or concerns at this time. Will follow up with urology. Pt denies any pain at this time. Pt wheeled out of ed by husband.

## 2021-04-08 NOTE — ED Triage Notes (Signed)
Pt comes pov with left sided flank pain moving around to her left lower abd. Hx of stones and states feels the same.

## 2021-04-08 NOTE — ED Notes (Signed)
Pt resting on bed with eyes closed and lights off husband at bedside. Pt states she does not have any pain at this time. Call bell in reach.

## 2021-04-08 NOTE — ED Provider Notes (Signed)
[facility] Emergency Department Provider Note ____________________________________________  Time seen: 1932  I have reviewed the triage vital signs and the nursing notes.  HISTORY  Chief Complaint  Flank Pain   HPI Rebecca Owens is a 35 y.o. female presents to the ED with couple days of left-sided flank pain.  That has progressed to some mild left lower quadrant pain.  Patient gives a history of kidney stones, reports symptoms feel the same.  She reports nausea without vomiting, denies any urinary retention, hematuria, or stone passage.  She denies any fevers at this time.  She took a single leftover Percocet from her previous episode of kidney stone from 1-1/2 years prior.  Past Medical History:  Diagnosis Date   Abnormal uterine bleeding (AUB) 10/12/2019   History of kidney stones    Influenza A 09/09/2017   Kidney stone    Recurrent pregnancy loss 07/09/2019   S/P cholecystectomy 01/2008    Patient Active Problem List   Diagnosis Date Noted   Gestational hypertension without significant proteinuria during pregnancy in third trimester, antepartum 10/03/2020   Normal labor 10/02/2020   Premature rupture of membranes 10/02/2020   Obesity in pregnancy, antepartum, third trimester 10/02/2020   Uterine size date discrepancy, antepartum 10/02/2020   Supervision of other high risk pregnancies, third trimester 04/03/2020   Lumbar radiculopathy 11/29/2019   Family history of genetic disease 07/09/2019   Acute midline thoracic back pain 10/08/2018   Recurrent pregnancy loss without current pregnancy     Past Surgical History:  Procedure Laterality Date   CHOLECYSTECTOMY     CYSTOSCOPY/URETEROSCOPY/HOLMIUM LASER/STENT PLACEMENT Left 10/15/2019   Procedure: CYSTOSCOPY/URETEROSCOPY/HOLMIUM LASER/STENT PLACEMENT;  Surgeon: Sondra Come, MD;  Location: [facility] ORS;  Service: Urology;  Laterality: Left;   DILATION AND EVACUATION N/A 05/01/2018   Procedure:  DILATATION AND EVACUATION;  Surgeon: Christeen Douglas, MD;  Location: [facility] ORS;  Service: Gynecology;  Laterality: N/A;    Prior to Admission medications   Medication Sig Start Date End Date Taking? Authorizing Provider  ondansetron (ZOFRAN ODT) 4 MG disintegrating tablet Take 1 tablet (4 mg total) by mouth every 8 (eight) hours as needed. 04/08/21  Yes Olivier Frayre, Charlesetta Ivory, PA-C  oxyCODONE-acetaminophen (PERCOCET) 5-325 MG tablet Take 1-2 tablets by mouth 3 (three) times daily as needed for up to 5 days for severe pain. 04/08/21 04/13/21 Yes Siedah Sedor, Charlesetta Ivory, PA-C  tamsulosin (FLOMAX) 0.4 MG CAPS capsule Take 1 capsule (0.4 mg total) by mouth daily after supper for 10 days. 04/08/21 04/18/21 Yes Onell Mcmath, Charlesetta Ivory, PA-C  acetaminophen (TYLENOL) 500 MG tablet Take 2 tablets (1,000 mg total) by mouth every 6 (six) hours as needed (for pain scale < 4). 10/04/20   McVey, Prudencio Pair, CNM  benzocaine-Menthol (DERMOPLAST) 20-0.5 % AERO Apply 1 application topically as needed for irritation (perineal discomfort). 10/04/20   McVey, Prudencio Pair, CNM  cetirizine (ZYRTEC) 10 MG tablet Take 10 mg by mouth daily.    [provider]  coconut oil OIL Apply 1 application topically as needed. 10/04/20   McVey, Prudencio Pair, CNM  docusate sodium (COLACE) 100 MG capsule Take 1 capsule (100 mg total) by mouth 2 (two) times daily. 10/04/20   McVey, Prudencio Pair, CNM  famotidine (PEPCID) 20 MG tablet Take by mouth.    [provider]  ibuprofen (ADVIL) 600 MG tablet Take 1 tablet (600 mg total) by mouth every 6 (six) hours. 10/04/20   McVey, Prudencio Pair, CNM  omeprazole (PRILOSEC) 10  MG capsule Take by mouth.    [provider]  Prenatal Vit-Fe Fumarate-FA (PRENATAL MULTIVITAMIN) TABS tablet Take 1 tablet by mouth at bedtime.    [provider]  witch hazel-glycerin (TUCKS) pad Apply 1 application topically continuous. 10/04/20   McVey, Prudencio Pair, CNM    Allergies Codeine  Family  History  Problem Relation Age of Onset   Hyperlipidemia Father    Heart disease Paternal Grandfather     Social History Social History   Tobacco Use   Smoking status: Never   Smokeless tobacco: Never  Vaping Use   Vaping Use: Never used  Substance Use Topics   Alcohol use: Not Currently   Drug use: No    Review of Systems  Constitutional: Negative for fever. Eyes: Negative for visual changes. ENT: Negative for sore throat. Cardiovascular: Negative for chest pain. Respiratory: Negative for shortness of breath. Gastrointestinal: Negative for abdominal pain, vomiting and diarrhea.  Left flank pain as above. Genitourinary: Negative for dysuria. Musculoskeletal: Negative for back pain. Skin: Negative for rash. Neurological: Negative for headaches, focal weakness or numbness. ____________________________________________  PHYSICAL EXAM:  VITAL SIGNS: ED Triage Vitals  Enc Vitals Group     BP 04/08/21 1833 (!) 152/111     Pulse Rate 04/08/21 1833 100     Resp 04/08/21 1833 18     Temp 04/08/21 1833 98 F (36.7 C)     Temp Source 04/08/21 1833 Oral     SpO2 04/08/21 1833 99 %     Weight 04/08/21 1832 260 lb (117.9 kg)     Height 04/08/21 1832 5\' 5"  (1.651 m)     Head Circumference --      Peak Flow --      Pain Score 04/08/21 1831 7     Pain Loc --      Pain Edu? --      Excl. in GC? --     Constitutional: Alert and oriented. Well appearing and in no distress. Head: Normocephalic and atraumatic. Eyes: Conjunctivae are normal. Normal extraocular movements Cardiovascular: Normal rate, regular rhythm. Normal distal pulses. Respiratory: Normal respiratory effort. No wheezes/rales/rhonchi. Gastrointestinal: Soft and nontender. No distention.  Normal bowel sounds with mild left flank tenderness appreciated. Musculoskeletal: Nontender with normal range of motion in all extremities.  Neurologic:  Normal gait without ataxia. Normal speech and language. No gross focal  neurologic deficits are appreciated. Skin:  Skin is warm, dry and intact. No rash noted. Psychiatric: Mood and affect are normal. Patient exhibits appropriate insight and judgment. ____________________________________________    {LABS (pertinent positives/negatives)  Labs Reviewed  BASIC METABOLIC PANEL - Abnormal; Notable for the following components:      Result Value   Glucose, Bld 102 (*)    Creatinine, Ser 1.09 (*)    All other components within normal limits  URINALYSIS, COMPLETE (UACMP) WITH MICROSCOPIC - Abnormal; Notable for the following components:   Color, Urine STRAW (*)    APPearance CLEAR (*)    All other components within normal limits  CBC  POC URINE PREG, ED  ____________________________________________  {EKG  ____________________________________________   RADIOLOGY Official radiology report(s): CT Renal Stone Study  Result Date: 04/08/2021 CLINICAL DATA:  Left-sided flank pain EXAM: CT ABDOMEN AND PELVIS WITHOUT CONTRAST TECHNIQUE: Multidetector CT imaging of the abdomen and pelvis was performed following the standard protocol without IV contrast. COMPARISON:  CT from 09/18/2019 FINDINGS: Lower chest: No acute abnormality. Hepatobiliary: No focal liver abnormality is seen. Status post cholecystectomy. No biliary  dilatation. Pancreas: Unremarkable. No pancreatic ductal dilatation or surrounding inflammatory changes. Spleen: Normal in size without focal abnormality. Adrenals/Urinary Tract: Adrenal glands are within normal limits. Right kidney is well visualize without renal calculi or obstructive changes. Left kidney demonstrates mild perinephric stranding as well as mild hydronephrosis and proximal hydroureter. These changes are secondary to 2 stones within the proximal left ureter larger of which measures 8 mm in dimension. A slightly smaller adjacent stone is noted as well. Multiple small 2-3 mm nonobstructing stones are noted within the left kidney. The more distal  left ureter is within normal limits. The bladder is decompressed. Stomach/Bowel: No obstructive or inflammatory changes of the colon are seen. The appendix is within normal limits. Small bowel and stomach are unremarkable. Vascular/Lymphatic: No significant vascular findings are present. No enlarged abdominal or pelvic lymph nodes. Reproductive: Uterus and bilateral adnexa are unremarkable. Tampon is noted in the vaginal vault. Other: No abdominal wall hernia or abnormality. No abdominopelvic ascites. Musculoskeletal: No acute or significant osseous findings. IMPRESSION: Left-sided hydronephrosis and proximal hydroureter secondary to 2 proximal left ureteral stones the largest of which measures approximately 8 mm. Smaller nonobstructing left renal stones are seen. No other focal abnormality is noted. Electronically Signed   By: Alcide Clever M.D.   On: 04/08/2021 20:22   ____________________________________________  PROCEDURES  Percocet 5-325 mg PO Zofran 4 mg ODT Dilaudid 1 mg IVP NS 500 ml bolus Reglan 10 mg IVP Tamsulosin 0.4 mg PO  Procedures ____________________________________________   INITIAL IMPRESSION / ASSESSMENT AND PLAN / ED COURSE  As part of my medical decision making, I reviewed the following data within the electronic MEDICAL RECORD NUMBER Labs reviewed WNL, Radiograph reviewed as noted, Notes from prior ED visits, and [place] Controlled Substance Database   S/W Dr. Vanna Scotland: she agree patient is stable for outpatient management.   ED evaluation of left flank pain with a history of nephrolithiasis presented to the ED.  Patient is evaluated for complaints in ED, found to have reassuring lab panel at this time.  CT imaging does reveal however, and a renal stone with hydronephrosis and hydroureter.  Patient is reporting significant provement of her pain after IV medication and fluid bolus.  She is stable for discharge at this time.  She will follow-up with urology as discussed.   Prescriptions for Percocet, Zofran, and tamsulosin provided for her benefit.  Rebecca Owens [facility] was evaluated in Emergency Department on 04/08/2021 for the symptoms described in the history of present illness. She was evaluated in the context of the global COVID-19 pandemic, which necessitated consideration that the patient might be at risk for infection with the SARS-CoV-2 virus that causes COVID-19. Institutional protocols and algorithms that pertain to the evaluation of patients at risk for COVID-19 are in a state of rapid change based on information released by regulatory bodies including the CDC and federal and state organizations. These policies and algorithms were followed during the patient's care in the ED.  I reviewed the patient's prescription history over the last 12 months in the multi-state controlled substances database(s) that includes [place], [place], [place], [place], [place], [place], [place], [place], [place], [place], [place], [place], [place], and [place].  Results were notable for no current RX. ____________________________________________  FINAL CLINICAL IMPRESSION(S) / ED DIAGNOSES  Final diagnoses:  Nephrolithiasis      Karmen Stabs, Charlesetta Ivory, PA-C 04/08/21 2213    Shaune Pollack, MD 04/11/21 1456

## 2021-04-08 NOTE — Discharge Instructions (Addendum)
Take the prescription meds as prescribed.  Follow-up with urology for ongoing symptom management.

## 2021-04-08 NOTE — ED Notes (Signed)
Pt returned from CT °

## 2021-04-08 NOTE — ED Notes (Signed)
  Pt to ED for L sided flank pain that started several days ago in back and has moved lower and to flank. Pain was dull at first and now is intermittently sharp. Pt has hx kidney stones with surgical removal in 2021. Denies pain, burning with urination at this time. Urine has been sent.

## 2021-04-09 ENCOUNTER — Ambulatory Visit: Payer: 59 | Admitting: Physician Assistant

## 2021-04-09 ENCOUNTER — Ambulatory Visit
Admission: RE | Admit: 2021-04-09 | Discharge: 2021-04-09 | Disposition: A | Discharge status: Home / Self Care | Payer: 59 | Source: Ambulatory Visit | Attending: Physician Assistant | Admitting: Physician Assistant

## 2021-04-09 ENCOUNTER — Encounter: Payer: Self-pay | Admitting: Physician Assistant

## 2021-04-09 VITALS — BP 120/80 | HR 90 | Ht 65.0 in | Wt 260.0 lb

## 2021-04-09 DIAGNOSIS — N202 Calculus of kidney with calculus of ureter: Secondary | ICD-10-CM | POA: Diagnosis present

## 2021-04-09 DIAGNOSIS — N201 Calculus of ureter: Secondary | ICD-10-CM

## 2021-04-09 LAB — URINALYSIS, COMPLETE
Bilirubin, UA: NEGATIVE
Glucose, UA: NEGATIVE
Leukocytes,UA: NEGATIVE
Nitrite, UA: NEGATIVE
Protein,UA: NEGATIVE
Specific Gravity, UA: 1.015 (ref 1.005–1.030)
Urobilinogen, Ur: 0.2 mg/dL (ref 0.2–1.0)
pH, UA: 6 (ref 5.0–7.5)

## 2021-04-09 LAB — MICROSCOPIC EXAMINATION: RBC, Urine: >30 /hpf — ABNORMAL HIGH (ref 0–2)

## 2021-04-09 NOTE — Progress Notes (Signed)
04/09/2021 3:44 PM   Denny Peon Judie Petit [facility] 1986/01/27 [id number]  CC: Chief Complaint  Patient presents with   Nephrolithiasis    HPI: Rebecca Owens is a 35 y.o. female with PMH nephrolithiasis who underwent left ureteroscopy with Dr. Richardo Hanks last year who presents today for ED follow-up and to discuss management of 2 proximal left ureteral stones.   She presented to the ED yesterday with reports of a several day history of intermittent, worsening left flank pain consistent with past stone episodes.  UA was bland at the time and urine pregnancy was negative.  She was noted to have slightly increased creatinine of 1.09 and CT stone study revealed 2 adjacent proximal left ureteral stones, the largest of which measuring 8 mm.  She also has some nonobstructing left nephrolithiasis.  She was discharged on Flomax, and Percocet and states she has some Zofran at home.  Today she reports continued intermittent flank pain nausea, though she is not having any pain at this time.  She denies fever or chills.  She recalls rather severe stent symptoms after her ureteroscopy last year and would like to try to avoid this again in the future if possible.  In-office UA today positive for trace ketones and 3+ blood; urine microscopy with >30 RBCs/HPF.  PMH: Past Medical History:  Diagnosis Date   Abnormal uterine bleeding (AUB) 10/12/2019   History of kidney stones    Influenza A 09/09/2017   Kidney stone    Recurrent pregnancy loss 07/09/2019   S/P cholecystectomy 01/2008    Surgical History: Past Surgical History:  Procedure Laterality Date   CHOLECYSTECTOMY     CYSTOSCOPY/URETEROSCOPY/HOLMIUM LASER/STENT PLACEMENT Left 10/15/2019   Procedure: CYSTOSCOPY/URETEROSCOPY/HOLMIUM LASER/STENT PLACEMENT;  Surgeon: Sondra Come, MD;  Location: [facility] ORS;  Service: Urology;  Laterality: Left;   DILATION AND EVACUATION N/A 05/01/2018   Procedure: DILATATION AND EVACUATION;  Surgeon: Christeen Douglas, MD;   Location: [facility] ORS;  Service: Gynecology;  Laterality: N/A;    Home Medications:  Allergies as of 04/09/2021       Reactions   Codeine Hives        Medication List        Accurate as of April 09, 2021  3:44 PM. If you have any questions, ask your nurse or doctor.          acetaminophen 500 MG tablet Commonly known as: TYLENOL Take 2 tablets (1,000 mg total) by mouth every 6 (six) hours as needed (for pain scale < 4).   benzocaine-Menthol 20-0.5 % Aero Commonly known as: DERMOPLAST Apply 1 application topically as needed for irritation (perineal discomfort).   cetirizine 10 MG tablet Commonly known as: ZYRTEC Take 10 mg by mouth daily.   coconut oil Oil Apply 1 application topically as needed.   docusate sodium 100 MG capsule Commonly known as: COLACE Take 1 capsule (100 mg total) by mouth 2 (two) times daily.   famotidine 20 MG tablet Commonly known as: PEPCID Take by mouth.   ibuprofen 600 MG tablet Commonly known as: ADVIL Take 1 tablet (600 mg total) by mouth every 6 (six) hours.   omeprazole 10 MG capsule Commonly known as: PRILOSEC Take by mouth.   ondansetron 4 MG disintegrating tablet Commonly known as: Zofran ODT Take 1 tablet (4 mg total) by mouth every 8 (eight) hours as needed.   oxyCODONE-acetaminophen 5-325 MG tablet Commonly known as: Percocet Take 1-2 tablets by mouth 3 (three) times daily as needed for up to 5 days  for severe pain.   prenatal multivitamin Tabs tablet Take 1 tablet by mouth at bedtime.   tamsulosin 0.4 MG Caps capsule Commonly known as: FLOMAX Take 1 capsule (0.4 mg total) by mouth daily after supper for 10 days.   witch hazel-glycerin pad Commonly known as: TUCKS Apply 1 application topically continuous.        Allergies:  Allergies  Allergen Reactions   Codeine Hives    Family History: Family History  Problem Relation Age of Onset   Hyperlipidemia Father    Heart disease Paternal Grandfather      Social History:   reports that she has never smoked. She has never used smokeless tobacco. She reports that she does not currently use alcohol. She reports that she does not use drugs.  Physical Exam: BP 120/80   Pulse 90   Ht 5\' 5"  (1.651 m)   Wt 260 lb (117.9 kg)   LMP 04/06/2021 (Exact Date)   BMI 43.27 kg/m   Constitutional:  Alert and oriented, no acute distress, nontoxic appearing HEENT: [place], AT Cardiovascular: No clubbing, cyanosis, or edema Respiratory: Normal respiratory effort, no increased work of breathing Skin: No rashes, bruises or suspicious lesions Neurologic: Grossly intact, no focal deficits, moving all 4 extremities Psychiatric: Normal mood and affect  Laboratory Data: Results for orders placed or performed in visit on 04/09/21  Microscopic Examination   Urine  Result Value Ref Range   WBC, UA 0-5 0 - 5 /hpf   RBC >30 (H) 0 - 2 /hpf   Epithelial Cells (non renal) 0-10 0 - 10 /hpf   Bacteria, UA Few (A) None seen/Few  Urinalysis, Complete  Result Value Ref Range   Specific Gravity, UA 1.015 1.005 - 1.030   pH, UA 6.0 5.0 - 7.5   Color, UA Yellow Yellow   Appearance Ur Clear Clear   Leukocytes,UA Negative Negative   Protein,UA Negative Negative/Trace   Glucose, UA Negative Negative   Ketones, UA Trace (A) Negative   RBC, UA 3+ (A) Negative   Bilirubin, UA Negative Negative   Urobilinogen, Ur 0.2 0.2 - 1.0 mg/dL   Nitrite, UA Negative Negative   Microscopic Examination See below:    Pertinent Imaging: Results for orders placed during the hospital encounter of 04/08/21  CT Renal Stone Study  Narrative CLINICAL DATA:  Left-sided flank pain  EXAM: CT ABDOMEN AND PELVIS WITHOUT CONTRAST  TECHNIQUE: Multidetector CT imaging of the abdomen and pelvis was performed following the standard protocol without IV contrast.  COMPARISON:  CT from 09/18/2019  FINDINGS: Lower chest: No acute abnormality.  Hepatobiliary: No focal liver abnormality is  seen. Status post cholecystectomy. No biliary dilatation.  Pancreas: Unremarkable. No pancreatic ductal dilatation or surrounding inflammatory changes.  Spleen: Normal in size without focal abnormality.  Adrenals/Urinary Tract: Adrenal glands are within normal limits. Right kidney is well visualize without renal calculi or obstructive changes. Left kidney demonstrates mild perinephric stranding as well as mild hydronephrosis and proximal hydroureter. These changes are secondary to 2 stones within the proximal left ureter larger of which measures 8 mm in dimension. A slightly smaller adjacent stone is noted as well. Multiple small 2-3 mm nonobstructing stones are noted within the left kidney. The more distal left ureter is within normal limits. The bladder is decompressed.  Stomach/Bowel: No obstructive or inflammatory changes of the colon are seen. The appendix is within normal limits. Small bowel and stomach are unremarkable.  Vascular/Lymphatic: No significant vascular findings are present. No enlarged abdominal  or pelvic lymph nodes.  Reproductive: Uterus and bilateral adnexa are unremarkable. Tampon is noted in the vaginal vault.  Other: No abdominal wall hernia or abnormality. No abdominopelvic ascites.  Musculoskeletal: No acute or significant osseous findings.  IMPRESSION: Left-sided hydronephrosis and proximal hydroureter secondary to 2 proximal left ureteral stones the largest of which measures approximately 8 mm.  Smaller nonobstructing left renal stones are seen.  No other focal abnormality is noted.   Electronically Signed By: Alcide Clever M.D. On: 04/08/2021 20:22  I personally reviewed the images referenced above and note to proximal left ureteral stones as well as nonobstructing left nephrolithiasis.  Assessment & Plan:   1. Left ureteral stone UA notable for microscopic hematuria today, low suspicion for infection.  She is VSS and asymptomatic  presently, no indication for urgent intervention.  We discussed various treatment options for her stones today including trial of passage versus ESWL versus ureteroscopy.  We discussed that ESWL is a less invasive treatment option that requires less anesthesia and does not involve placement of a ureteral stent, however she would need to pass her stone fragments afterward and this may involve ongoing flank pain.  Given her multiple stones proximity to 1 another, I think we could treat both of these in 1 session.  We discussed the low risk of treatment failure requiring either repeat ESWL or ureteroscopy.  Alternatively, we discussed that ureteroscopy is more invasive, requires general anesthesia, and involves placement of a ureteral stent postoperatively, however it would allow [place] to clear her nonobstructive left nephrolithiasis at the same time.  Based on this conversation, patient wishes to proceed with ESWL this week.  I am in agreement with this plan assuming that her stones are radiopaque.  I sent her from clinic today to obtain a KUB.  We discussed that if her stones are visible, we will move forward with plans for ESWL on Thursday.  Patient to continue Flomax, stay well-hydrated, strain her urine, and manage her pain and nausea/vomiting with Percocet and Zofran, respectively.  We discussed refraining from NSAIDs after today.  We discussed return precautions today including fever, chills, uncontrollable pain, and uncontrollable nausea/vomiting.  Patient expressed understanding. - Urinalysis, Complete - DG Abd 1 View; Future  Return for Will call with results.  Carman Ching, PA-C  [facility] [address] [place], [place] [address] [phone]

## 2021-04-09 NOTE — Patient Instructions (Addendum)
Please go get an abdominal X-ray at the radiology desk before you leave the hospital today. I will call you when I get your results; if your stones are visible on X-ray, then we will proceed with plans for shockwave lithotripsy this week. In the meantime, please do the following: -Take Flomax 0.4mg  daily -Stay well hydrated -Strain your urine to catch any stones that pass -Treat any pain with Tylenol or Percocet -Treat any nausea with Zofran -DO NOT TAKE non-steroidal anti-inflammatory drugs including ibuprofen (Advil) or naproxen (Aleve)  Please call our office immediately (we are open 8a-5p Monday-Friday) or go to the Emergency Department if you develop any of the following: -Fever -Chills -Nausea and/or vomiting uncontrollable with Zofran -Pain uncontrollable with Percocet

## 2021-04-10 ENCOUNTER — Encounter: Payer: Self-pay | Admitting: Emergency Medicine

## 2021-04-10 ENCOUNTER — Telehealth: Payer: Self-pay | Admitting: Physician Assistant

## 2021-04-10 ENCOUNTER — Emergency Department
Admission: EM | Admit: 2021-04-10 | Discharge: 2021-04-10 | Disposition: A | Discharge status: Home / Self Care | Payer: 59 | Attending: Emergency Medicine | Admitting: Emergency Medicine

## 2021-04-10 ENCOUNTER — Other Ambulatory Visit: Payer: Self-pay

## 2021-04-10 ENCOUNTER — Other Ambulatory Visit: Payer: Self-pay | Admitting: Physician Assistant

## 2021-04-10 DIAGNOSIS — N23 Unspecified renal colic: Secondary | ICD-10-CM

## 2021-04-10 DIAGNOSIS — R109 Unspecified abdominal pain: Secondary | ICD-10-CM | POA: Diagnosis present

## 2021-04-10 DIAGNOSIS — Z20822 Contact with and (suspected) exposure to covid-19: Secondary | ICD-10-CM | POA: Diagnosis not present

## 2021-04-10 DIAGNOSIS — N201 Calculus of ureter: Secondary | ICD-10-CM

## 2021-04-10 LAB — COMPREHENSIVE METABOLIC PANEL
ALT: 27 U/L (ref 0–44)
AST: 29 U/L (ref 15–41)
Albumin: 4.5 g/dL (ref 3.5–5.0)
Alkaline Phosphatase: 81 U/L (ref 38–126)
Anion gap: 6 (ref 5–15)
BUN: 19 mg/dL (ref 6–20)
CO2: 26 mmol/L (ref 22–32)
Calcium: 8.8 mg/dL — ABNORMAL LOW (ref 8.9–10.3)
Chloride: 106 mmol/L (ref 98–111)
Creatinine, Ser: 1.17 mg/dL — ABNORMAL HIGH (ref 0.44–1.00)
GFR, Estimated: >60 mL/min (ref >60)
Glucose, Bld: 133 mg/dL — ABNORMAL HIGH (ref 70–99)
Potassium: 4.1 mmol/L (ref 3.5–5.1)
Sodium: 138 mmol/L (ref 135–145)
Total Bilirubin: 0.7 mg/dL (ref 0.3–1.2)
Total Protein: 7.6 g/dL (ref 6.5–8.1)

## 2021-04-10 LAB — RESP PANEL BY RT-PCR (FLU A&B, COVID) ARPGX2
Influenza A by PCR: NEGATIVE
Influenza B by PCR: NEGATIVE
SARS Coronavirus 2 by RT PCR: NEGATIVE

## 2021-04-10 LAB — CBC WITH DIFFERENTIAL/PLATELET
Abs Immature Granulocytes: 0.05 10*3/uL (ref 0.00–0.07)
Basophils Absolute: 0.1 10*3/uL (ref 0.0–0.1)
Basophils Relative: 1 %
Eosinophils Absolute: 0.1 10*3/uL (ref 0.0–0.5)
Eosinophils Relative: 1 %
HCT: 40.2 % (ref 36.0–46.0)
Hemoglobin: 13.9 g/dL (ref 12.0–15.0)
Immature Granulocytes: 0 %
Lymphocytes Relative: 13 %
Lymphs Abs: 1.4 10*3/uL (ref 0.7–4.0)
MCH: 31.4 pg (ref 26.0–34.0)
MCHC: 34.6 g/dL (ref 30.0–36.0)
MCV: 90.7 fL (ref 80.0–100.0)
Monocytes Absolute: 0.8 10*3/uL (ref 0.1–1.0)
Monocytes Relative: 7 %
Neutro Abs: 8.9 10*3/uL — ABNORMAL HIGH (ref 1.7–7.7)
Neutrophils Relative %: 78 %
Platelets: 219 10*3/uL (ref 150–400)
RBC: 4.43 MIL/uL (ref 3.87–5.11)
RDW: 11.8 % (ref 11.5–15.5)
WBC: 11.2 10*3/uL — ABNORMAL HIGH (ref 4.0–10.5)
nRBC: 0 % (ref 0.0–0.2)

## 2021-04-10 LAB — URINALYSIS, ROUTINE W REFLEX MICROSCOPIC
Bacteria, UA: NONE SEEN
Bilirubin Urine: NEGATIVE
Glucose, UA: NEGATIVE mg/dL
Ketones, ur: 5 mg/dL — AB
Leukocytes,Ua: NEGATIVE
Nitrite: NEGATIVE
Protein, ur: NEGATIVE mg/dL
RBC / HPF: >50 RBC/hpf — ABNORMAL HIGH (ref 0–5)
Specific Gravity, Urine: 1.025 (ref 1.005–1.030)
pH: 7 (ref 5.0–8.0)

## 2021-04-10 MED ORDER — KETOROLAC TROMETHAMINE 10 MG PO TABS: 10.0000 mg | ORAL_TABLET | Freq: Four times a day (QID) | ORAL | 0 refills | Status: DC | PRN

## 2021-04-10 MED ORDER — TAMSULOSIN HCL 0.4 MG PO CAPS
0.4000 mg | ORAL_CAPSULE | Freq: Once | ORAL | Status: AC
  Administered 2021-04-10: 0.4 mg via ORAL
  Filled 2021-04-10: qty 1

## 2021-04-10 MED ORDER — HYDROCODONE-ACETAMINOPHEN 5-325 MG PO TABS: 1.0000 | ORAL_TABLET | ORAL | 0 refills | Status: DC | PRN

## 2021-04-10 MED ORDER — FENTANYL CITRATE PF 50 MCG/ML IJ SOSY: 50.0000 ug | PREFILLED_SYRINGE | INTRAMUSCULAR | Status: DC | PRN

## 2021-04-10 MED ORDER — ONDANSETRON HCL 4 MG/2ML IJ SOLN
4.0000 mg | Freq: Once | INTRAMUSCULAR | Status: AC
  Administered 2021-04-10: 4 mg via INTRAVENOUS
  Filled 2021-04-10: qty 2

## 2021-04-10 MED ORDER — KETOROLAC TROMETHAMINE 30 MG/ML IJ SOLN
15.0000 mg | Freq: Once | INTRAMUSCULAR | Status: AC
  Administered 2021-04-10: 15 mg via INTRAVENOUS
  Filled 2021-04-10: qty 1

## 2021-04-10 MED ORDER — FENTANYL CITRATE PF 50 MCG/ML IJ SOSY
50.0000 ug | PREFILLED_SYRINGE | Freq: Once | INTRAMUSCULAR | Status: AC
  Administered 2021-04-10: 50 ug via INTRAVENOUS
  Filled 2021-04-10: qty 1

## 2021-04-10 MED ORDER — ONDANSETRON 4 MG PO TBDP: 4.0000 mg | ORAL_TABLET | Freq: Three times a day (TID) | ORAL | 0 refills | Status: DC | PRN

## 2021-04-10 NOTE — H&P (View-Only) (Signed)
Urology Consult  I have been asked to see the patient by Dr. Don Perking, for evaluation and management of left renal colic.  Chief Complaint: Left flank pain, nausea, and vomiting  History of Present Illness: Rebecca Owens is a 35 y.o. year old female with 2 known proximal left ureteral stones who presented to the ED this morning with pain and nausea/vomiting refractory to Zofran and Percocet.  I saw her in clinic yesterday to discuss treatment options for her known ureteral stones.  We are planning for ESWL this Thursday.  We discussed treating pain with Percocet and nausea/vomiting with Zofran.  She had already been started on Flomax.  She was not having pain when I saw her yesterday.  I sent her from clinic for KUB to verify radiopaque stones, on review of this imaging her stones are visible and she is okay to proceed for ESWL.  Today she reports her left flank pain recurred last night around 7 PM and was severe, minimally responsive to Percocet.  She also developed refractory nausea and vomiting such that she came to the ED this morning.  UA today notable for microscopic hematuria with >50 RBCs/hpf, but without nitrites, pyuria, or bacteriuria.  Creatinine is stably elevated at 1.17.  She has mild leukocytosis of 11.2.  She reports her pain is significantly improved after receiving Toradol in the ED this morning.  She would like to avoid urgent ureteral stent placement if at all possible given that she had rather pronounced stent symptoms after ureteroscopy in 2021.  Past Medical History:  Diagnosis Date   Abnormal uterine bleeding (AUB) 10/12/2019   History of kidney stones    Influenza A 09/09/2017   Kidney stone    Recurrent pregnancy loss 07/09/2019   S/P cholecystectomy 01/2008    Past Surgical History:  Procedure Laterality Date   CHOLECYSTECTOMY     CYSTOSCOPY/URETEROSCOPY/HOLMIUM LASER/STENT PLACEMENT Left 10/15/2019   Procedure: CYSTOSCOPY/URETEROSCOPY/HOLMIUM LASER/STENT  PLACEMENT;  Surgeon: Sondra Come, MD;  Location: [facility] ORS;  Service: Urology;  Laterality: Left;   DILATION AND EVACUATION N/A 05/01/2018   Procedure: DILATATION AND EVACUATION;  Surgeon: Christeen Douglas, MD;  Location: [facility] ORS;  Service: Gynecology;  Laterality: N/A;    Home Medications:  Current Meds  Medication Sig   HYDROcodone-acetaminophen (NORCO) 5-325 MG tablet Take 1-2 tablets by mouth every 4 (four) hours as needed for moderate pain.   ketorolac (TORADOL) 10 MG tablet Take 1 tablet (10 mg total) by mouth every 6 (six) hours as needed.   ondansetron (ZOFRAN ODT) 4 MG disintegrating tablet Take 1 tablet (4 mg total) by mouth every 8 (eight) hours as needed for nausea or vomiting.    Allergies:  Allergies  Allergen Reactions   Codeine Hives    Family History  Problem Relation Age of Onset   Hyperlipidemia Father    Heart disease Paternal Grandfather     Social History:  reports that she has never smoked. She has never used smokeless tobacco. She reports that she does not currently use alcohol. She reports that she does not use drugs.  ROS: A complete review of systems was performed.  All systems are negative except for pertinent findings as noted.  Physical Exam:  Vital signs in last 24 hours: Temp:  [98 F (36.7 C)] 98 F (36.7 C) (10/18 0611) Pulse Rate:  [66-90] 66 (10/18 0855) Resp:  [16-18] 17 (10/18 0855) BP: (100-144)/(49-100) 100/49 (10/18 0855) SpO2:  [94 %-99 %] 94 % (10/18 0946) Weight:  [  117.9 kg] 117.9 kg (10/18 0610) Constitutional:  Alert and oriented, no acute distress HEENT: [place] AT, moist mucus membranes Cardiovascular: No clubbing, cyanosis, or edema Respiratory: Normal respiratory effort Skin: No rashes, bruises or suspicious lesions Neurologic: Grossly intact, no focal deficits, moving all 4 extremities Psychiatric: Normal mood and affect  Laboratory Data:  Recent Labs    04/08/21 1834 04/10/21 0613  WBC 8.2 11.2*  HGB 13.7 13.9   HCT 39.0 40.2   Recent Labs    04/08/21 1834 04/10/21 0613  NA 138 138  K 4.1 4.1  CL 103 106  CO2 27 26  GLUCOSE 102* 133*  BUN 17 19  CREATININE 1.09* 1.17*  CALCIUM 9.3 8.8*   Urinalysis    Component Value Date/Time   COLORURINE YELLOW (A) 04/10/2021 0620   APPEARANCEUR HAZY (A) 04/10/2021 0620   APPEARANCEUR Clear 04/09/2021 1438   LABSPEC 1.025 04/10/2021 0620   LABSPEC 1.010 09/25/2013 1524   PHURINE 7.0 04/10/2021 0620   GLUCOSEU NEGATIVE 04/10/2021 0620   GLUCOSEU NEGATIVE 09/25/2013 1524   HGBUR MODERATE (A) 04/10/2021 0620   BILIRUBINUR NEGATIVE 04/10/2021 0620   BILIRUBINUR Negative 04/09/2021 1438   BILIRUBINUR NEGATIVE 09/25/2013 1524   KETONESUR 5 (A) 04/10/2021 0620   PROTEINUR NEGATIVE 04/10/2021 0620   NITRITE NEGATIVE 04/10/2021 0620   LEUKOCYTESUR NEGATIVE 04/10/2021 0620   LEUKOCYTESUR NEGATIVE 09/25/2013 1524   Results for orders placed or performed during the hospital encounter of 04/10/21  Resp Panel by RT-PCR (Flu A&B, Covid) Nasopharyngeal Swab     Status: None   Collection Time: 04/10/21  6:45 AM   Specimen: Nasopharyngeal Swab; Nasopharyngeal(NP) swabs in vial transport medium  Result Value Ref Range Status   SARS Coronavirus 2 by RT PCR NEGATIVE NEGATIVE Final    Comment: (NOTE) SARS-CoV-2 target nucleic acids are NOT DETECTED.  The SARS-CoV-2 RNA is generally detectable in upper respiratory specimens during the acute phase of infection. The lowest concentration of SARS-CoV-2 viral copies this assay can detect is 138 copies/mL. A negative result does not preclude SARS-Cov-2 infection and should not be used as the sole basis for treatment or other patient management decisions. A negative result may occur with  improper specimen collection/handling, submission of specimen other than nasopharyngeal swab, presence of viral mutation(s) within the areas targeted by this assay, and inadequate number of viral copies(<138 copies/mL). A  negative result must be combined with clinical observations, patient history, and epidemiological information. The expected result is Negative.  Fact Sheet for Patients:  https://www.fda.gov/media/152166/download  Fact Sheet for Healthcare Providers:  https://www.fda.gov/media/152162/download  This test is no t yet approved or cleared by the [place] FDA and  has been authorized for detection and/or diagnosis of SARS-CoV-2 by FDA under an Emergency Use Authorization (EUA). This EUA will remain  in effect (meaning this test can be used) for the duration of the COVID-19 declaration under Section 564(b)(1) of the Act, 21 U.S.C.section 360bbb-3(b)(1), unless the authorization is terminated  or revoked sooner.       Influenza A by PCR NEGATIVE NEGATIVE Final   Influenza B by PCR NEGATIVE NEGATIVE Final    Comment: (NOTE) The Xpert Xpress SARS-CoV-2/FLU/RSV plus assay is intended as an aid in the diagnosis of influenza from Nasopharyngeal swab specimens and should not be used as a sole basis for treatment. Nasal washings and aspirates are unacceptable for Xpert Xpress SARS-CoV-2/FLU/RSV testing.  Fact Sheet for Patients: https://www.fda.gov/media/152166/download  Fact Sheet for Healthcare Providers: https://www.fda.gov/media/152162/download  This test is not yet approved   or cleared by the [place] FDA and has been authorized for detection and/or diagnosis of SARS-CoV-2 by FDA under an Emergency Use Authorization (EUA). This EUA will remain in effect (meaning this test can be used) for the duration of the COVID-19 declaration under Section 564(b)(1) of the Act, 21 U.S.C. section 360bbb-3(b)(1), unless the authorization is terminated or revoked.  Performed at [facility], [address] [place], [place] [address]    Assessment & Plan:  35-year-old female with PMH nephrolithiasis currently with 2 proximal left ureteral stones with plans for ESWL in 2 days  who presented to the ED with uncontrollable pain, nausea, and vomiting.  Her symptoms have significantly improved since presentation after receiving Toradol.  Low suspicion for infection based on UA today.  Based on her symptomatic improvement, will plan for discharge with Flomax, Zofran, Percocet, and a low volume of p.o. Toradol to last her through the end of today.  We discussed that she should not take Toradol after this evening in advance of ESWL on Thursday.  She expressed understanding.  We discussed that if her refractory symptoms return prior to procedure, she should return to the emergency department and we may have to proceed with urgent ureteral stent placement.  Recommendations: -Continue Flomax, Zofran, Percocet, okay to take p.o. Toradol through the end of today -ESWL on Thursday -Return to the ED for refractory renal colic, nausea, or vomiting  Thank you for involving me in this patient's care, please page with any further questions or concerns.  Jaryah Aracena, PA-C 04/10/2021 10:25 AM   

## 2021-04-10 NOTE — Telephone Encounter (Signed)
Per PA [facility] Patient is to be scheduled for Left ESWL with Dr. Richardo Hanks.  Rebecca Owens  was contacted and possible surgical dates were discussed, 04/12/21 was agreed upon for surgery.   Instructions were given not to eat or drink from midnight on the night before surgery and have a driver for the day of surgery. On the surgery day patient was instructed to enter through the [facility] entrance of [facility] report the Same Day Surgery desk.     Reminder of this information was sent via mychart to the patient.

## 2021-04-10 NOTE — ED Provider Notes (Signed)
[facility] Emergency Department Provider Note  ____________________________________________  Time seen: Approximately 6:34 AM  I have reviewed the triage vital signs and the nursing notes.   HISTORY  Chief Complaint Flank Pain   HPI Rebecca Owens is a 35 y.o. female history of kidney stones who presents for evaluation of flank pain.  Patient was diagnosed with a 2 proximal kidney stones with one measuring 8 mm 2 days ago.  She has been taking Flomax and Percocet at home but reports that her pain is unbearable.  She is complaining of 10 out of 10 pain that is located in the left flank accompanied by nausea and vomiting.  No dysuria or hematuria, no fever or chills.  She is scheduled for lithotripsy in 2 days with urology.  She has had 2 prior lithotripsies in the past   Past Medical History:  Diagnosis Date   Abnormal uterine bleeding (AUB) 10/12/2019   History of kidney stones    Influenza A 09/09/2017   Kidney stone    Recurrent pregnancy loss 07/09/2019   S/P cholecystectomy 01/2008    Patient Active Problem List   Diagnosis Date Noted   Gestational hypertension without significant proteinuria during pregnancy in third trimester, antepartum 10/03/2020   Normal labor 10/02/2020   Premature rupture of membranes 10/02/2020   Obesity in pregnancy, antepartum, third trimester 10/02/2020   Uterine size date discrepancy, antepartum 10/02/2020   Supervision of other high risk pregnancies, third trimester 04/03/2020   Lumbar radiculopathy 11/29/2019   Family history of genetic disease 07/09/2019   Acute midline thoracic back pain 10/08/2018   Recurrent pregnancy loss without current pregnancy     Past Surgical History:  Procedure Laterality Date   CHOLECYSTECTOMY     CYSTOSCOPY/URETEROSCOPY/HOLMIUM LASER/STENT PLACEMENT Left 10/15/2019   Procedure: CYSTOSCOPY/URETEROSCOPY/HOLMIUM LASER/STENT PLACEMENT;  Surgeon: Sondra Come, MD;  Location: [facility] ORS;   Service: Urology;  Laterality: Left;   DILATION AND EVACUATION N/A 05/01/2018   Procedure: DILATATION AND EVACUATION;  Surgeon: Christeen Douglas, MD;  Location: [facility] ORS;  Service: Gynecology;  Laterality: N/A;    Prior to Admission medications   Medication Sig Start Date End Date Taking? Authorizing Provider  acetaminophen (TYLENOL) 500 MG tablet Take 2 tablets (1,000 mg total) by mouth every 6 (six) hours as needed (for pain scale < 4). Patient not taking: Reported on 04/09/2021 10/04/20   McVey, Prudencio Pair, CNM  benzocaine-Menthol (DERMOPLAST) 20-0.5 % AERO Apply 1 application topically as needed for irritation (perineal discomfort). Patient not taking: Reported on 04/09/2021 10/04/20   McVey, Prudencio Pair, CNM  cetirizine (ZYRTEC) 10 MG tablet Take 10 mg by mouth daily. Patient not taking: Reported on 04/09/2021    [provider]  coconut oil OIL Apply 1 application topically as needed. Patient not taking: Reported on 04/09/2021 10/04/20   McVey, Prudencio Pair, CNM  docusate sodium (COLACE) 100 MG capsule Take 1 capsule (100 mg total) by mouth 2 (two) times daily. Patient not taking: Reported on 04/09/2021 10/04/20   McVey, Prudencio Pair, CNM  famotidine (PEPCID) 20 MG tablet Take by mouth. Patient not taking: Reported on 04/09/2021    [provider]  ibuprofen (ADVIL) 600 MG tablet Take 1 tablet (600 mg total) by mouth every 6 (six) hours. Patient not taking: Reported on 04/09/2021 10/04/20   McVey, Prudencio Pair, CNM  omeprazole (PRILOSEC) 10 MG capsule Take by mouth. Patient not taking: Reported on 04/09/2021    [provider]  ondansetron (ZOFRAN ODT) 4  MG disintegrating tablet Take 1 tablet (4 mg total) by mouth every 8 (eight) hours as needed. 04/08/21   Menshew, Charlesetta Ivory, PA-C  oxyCODONE-acetaminophen (PERCOCET) 5-325 MG tablet Take 1-2 tablets by mouth 3 (three) times daily as needed for up to 5 days for severe pain. 04/08/21 04/13/21  Menshew, Charlesetta Ivory, PA-C   Prenatal Vit-Fe Fumarate-FA (PRENATAL MULTIVITAMIN) TABS tablet Take 1 tablet by mouth at bedtime.    [provider]  tamsulosin (FLOMAX) 0.4 MG CAPS capsule Take 1 capsule (0.4 mg total) by mouth daily after supper for 10 days. 04/08/21 04/18/21  Menshew, Charlesetta Ivory, PA-C  witch hazel-glycerin (TUCKS) pad Apply 1 application topically continuous. Patient not taking: Reported on 04/09/2021 10/04/20   McVey, Prudencio Pair, CNM    Allergies Codeine  Family History  Problem Relation Age of Onset   Hyperlipidemia Father    Heart disease Paternal Grandfather     Social History Social History   Tobacco Use   Smoking status: Never   Smokeless tobacco: Never  Vaping Use   Vaping Use: Never used  Substance Use Topics   Alcohol use: Not Currently   Drug use: No    Review of Systems  Constitutional: Negative for fever. Eyes: Negative for visual changes. ENT: Negative for sore throat. Neck: No neck pain  Cardiovascular: Negative for chest pain. Respiratory: Negative for shortness of breath. Gastrointestinal: Negative for abdominal pain or diarrhea. + N/V Genitourinary: Negative for dysuria. + L flank pain Musculoskeletal: Negative for back pain. Skin: Negative for rash. Neurological: Negative for headaches, weakness or numbness. Psych: No SI or HI  ____________________________________________   PHYSICAL EXAM:  VITAL SIGNS: ED Triage Vitals  Enc Vitals Group     BP 04/10/21 0611 (!) 144/100     Pulse Rate 04/10/21 0611 74     Resp 04/10/21 0611 18     Temp 04/10/21 0611 98 F (36.7 C)     Temp Source 04/10/21 0611 Oral     SpO2 04/10/21 0611 99 %     Weight 04/10/21 0610 260 lb (117.9 kg)     Height 04/10/21 0610 5\' 5"  (1.651 m)     Head Circumference --      Peak Flow --      Pain Score 04/10/21 0610 9     Pain Loc --      Pain Edu? --      Excl. in GC? --     Constitutional: Alert and oriented. Well appearing and in no apparent distress. HEENT:       Head: Normocephalic and atraumatic.         Eyes: Conjunctivae are normal. Sclera is non-icteric.       Mouth/Throat: Mucous membranes are moist.       Neck: Supple with no signs of meningismus. Cardiovascular: Regular rate and rhythm. No murmurs, gallops, or rubs. 2+ symmetrical distal pulses are present in all extremities. No JVD. Respiratory: Normal respiratory effort. Lungs are clear to auscultation bilaterally.  Gastrointestinal: Soft, non tender, and non distended with positive bowel sounds. No rebound or guarding. Genitourinary: No CVA tenderness. Musculoskeletal:  No edema, cyanosis, or erythema of extremities. Neurologic: Normal speech and language. Face is symmetric. Moving all extremities. No gross focal neurologic deficits are appreciated. Skin: Skin is warm, dry and intact. No rash noted. Psychiatric: Mood and affect are normal. Speech and behavior are normal.  ____________________________________________   LABS (all labs ordered are listed, but only abnormal results are displayed)  Labs Reviewed  CBC WITH DIFFERENTIAL/PLATELET - Abnormal; Notable for the following components:      Result Value   WBC 11.2 (*)    Neutro Abs 8.9 (*)    All other components within normal limits  RESP PANEL BY RT-PCR (FLU A&B, COVID) [id number]  COMPREHENSIVE METABOLIC PANEL  URINALYSIS, ROUTINE W REFLEX MICROSCOPIC   ____________________________________________  EKG  none  ____________________________________________  RADIOLOGY  none  ____________________________________________   PROCEDURES  Procedure(s) performed: None Procedures   Critical Care performed:  None ____________________________________________   INITIAL IMPRESSION / ASSESSMENT AND PLAN / ED COURSE  35 y.o. female history of kidney stones who presents for evaluation of flank pain.  Patient with persistent flank pain, nausea and vomiting after being diagnosed with 2 x 8 mm proximal left-sided kidney stones 2  days ago.  We will check labs to rule out sepsis, AKI, or overlying UTI.  Will discuss with urology for possibility of stenting today due to patient's persistent symptoms.  I discussed with Dr. Annabell Howells who is on-call for urology at this time but he recommended calling back at 7 AM to speak with Dr. Gabrielle Dare      _____________________________________________ Please note:  Patient was evaluated in Emergency Department today for the symptoms described in the history of present illness. Patient was evaluated in the context of the global COVID-19 pandemic, which necessitated consideration that the patient might be at risk for infection with the SARS-CoV-2 virus that causes COVID-19. Institutional protocols and algorithms that pertain to the evaluation of patients at risk for COVID-19 are in a state of rapid change based on information released by regulatory bodies including the CDC and federal and state organizations. These policies and algorithms were followed during the patient's care in the ED.  Some ED evaluations and interventions may be delayed as a result of limited staffing during the pandemic.   [place] Controlled Substance Database was reviewed by me. ____________________________________________   FINAL CLINICAL IMPRESSION(S) / ED DIAGNOSES   Final diagnoses:  Renal colic  Ureteral stone      NEW MEDICATIONS STARTED DURING THIS VISIT:  ED Discharge Orders     None        Note:  This document was prepared using Dragon voice recognition software and may include unintentional dictation errors.    Nita Sickle, MD 04/10/21 [phone]

## 2021-04-10 NOTE — Progress Notes (Signed)
ESWL ORDER FORM  Expected date of procedure: 04/12/2021  Surgeon: Nickolas Madrid, MD  Post op standing: 2-4wk follow up w/KUB prior  Anticoagulation/Aspirin/NSAID standing order: Hold all 24 hours prior  Anesthesia standing order: MAC  VTE standing: SCD's  Dx: Left Ureteral Stone  Procedure: left Extracorporeal shock wave lithotripsy  CPT : 23343  Standing Order Set:   *NPO after mn, KUB  *NS 127m/hr, Keflex 5029mPO, Benadryl 2544mO, Valium 77m42m, Zofran 4mg 75m   Medications if other than standing orders:   NONE

## 2021-04-10 NOTE — Consult Note (Signed)
Urology Consult  I have been asked to see the patient by Dr. Don Perking, for evaluation and management of left renal colic.  Chief Complaint: Left flank pain, nausea, and vomiting  History of Present Illness: Rebecca Owens is a 35 y.o. year old female with 2 known proximal left ureteral stones who presented to the ED this morning with pain and nausea/vomiting refractory to Zofran and Percocet.  I saw her in clinic yesterday to discuss treatment options for her known ureteral stones.  We are planning for ESWL this Thursday.  We discussed treating pain with Percocet and nausea/vomiting with Zofran.  She had already been started on Flomax.  She was not having pain when I saw her yesterday.  I sent her from clinic for KUB to verify radiopaque stones, on review of this imaging her stones are visible and she is okay to proceed for ESWL.  Today she reports her left flank pain recurred last night around 7 PM and was severe, minimally responsive to Percocet.  She also developed refractory nausea and vomiting such that she came to the ED this morning.  UA today notable for microscopic hematuria with >50 RBCs/hpf, but without nitrites, pyuria, or bacteriuria.  Creatinine is stably elevated at 1.17.  She has mild leukocytosis of 11.2.  She reports her pain is significantly improved after receiving Toradol in the ED this morning.  She would like to avoid urgent ureteral stent placement if at all possible given that she had rather pronounced stent symptoms after ureteroscopy in 2021.  Past Medical History:  Diagnosis Date   Abnormal uterine bleeding (AUB) 10/12/2019   History of kidney stones    Influenza A 09/09/2017   Kidney stone    Recurrent pregnancy loss 07/09/2019   S/P cholecystectomy 01/2008    Past Surgical History:  Procedure Laterality Date   CHOLECYSTECTOMY     CYSTOSCOPY/URETEROSCOPY/HOLMIUM LASER/STENT PLACEMENT Left 10/15/2019   Procedure: CYSTOSCOPY/URETEROSCOPY/HOLMIUM LASER/STENT  PLACEMENT;  Surgeon: Sondra Come, MD;  Location: [facility] ORS;  Service: Urology;  Laterality: Left;   DILATION AND EVACUATION N/A 05/01/2018   Procedure: DILATATION AND EVACUATION;  Surgeon: Christeen Douglas, MD;  Location: [facility] ORS;  Service: Gynecology;  Laterality: N/A;    Home Medications:  Current Meds  Medication Sig   HYDROcodone-acetaminophen (NORCO) 5-325 MG tablet Take 1-2 tablets by mouth every 4 (four) hours as needed for moderate pain.   ketorolac (TORADOL) 10 MG tablet Take 1 tablet (10 mg total) by mouth every 6 (six) hours as needed.   ondansetron (ZOFRAN ODT) 4 MG disintegrating tablet Take 1 tablet (4 mg total) by mouth every 8 (eight) hours as needed for nausea or vomiting.    Allergies:  Allergies  Allergen Reactions   Codeine Hives    Family History  Problem Relation Age of Onset   Hyperlipidemia Father    Heart disease Paternal Grandfather     Social History:  reports that she has never smoked. She has never used smokeless tobacco. She reports that she does not currently use alcohol. She reports that she does not use drugs.  ROS: A complete review of systems was performed.  All systems are negative except for pertinent findings as noted.  Physical Exam:  Vital signs in last 24 hours: Temp:  [98 F (36.7 C)] 98 F (36.7 C) (10/18 0611) Pulse Rate:  [66-90] 66 (10/18 0855) Resp:  [16-18] 17 (10/18 0855) BP: (100-144)/(49-100) 100/49 (10/18 0855) SpO2:  [94 %-99 %] 94 % (10/18 0946) Weight:  [  117.9 kg] 117.9 kg (10/18 0610) Constitutional:  Alert and oriented, no acute distress HEENT:  AT, moist mucus membranes Cardiovascular: No clubbing, cyanosis, or edema Respiratory: Normal respiratory effort Skin: No rashes, bruises or suspicious lesions Neurologic: Grossly intact, no focal deficits, moving all 4 extremities Psychiatric: Normal mood and affect  Laboratory Data:  Recent Labs    04/08/21 1834 04/10/21 0613  WBC 8.2 11.2*  HGB 13.7 13.9   HCT 39.0 40.2   Recent Labs    04/08/21 1834 04/10/21 0613  NA 138 138  K 4.1 4.1  CL 103 106  CO2 27 26  GLUCOSE 102* 133*  BUN 17 19  CREATININE 1.09* 1.17*  CALCIUM 9.3 8.8*   Urinalysis    Component Value Date/Time   COLORURINE YELLOW (A) 04/10/2021 0620   APPEARANCEUR HAZY (A) 04/10/2021 0620   APPEARANCEUR Clear 04/09/2021 1438   LABSPEC 1.025 04/10/2021 0620   LABSPEC 1.010 09/25/2013 1524   PHURINE 7.0 04/10/2021 0620   GLUCOSEU NEGATIVE 04/10/2021 0620   GLUCOSEU NEGATIVE 09/25/2013 1524   HGBUR MODERATE (A) 04/10/2021 0620   BILIRUBINUR NEGATIVE 04/10/2021 0620   BILIRUBINUR Negative 04/09/2021 1438   BILIRUBINUR NEGATIVE 09/25/2013 1524   KETONESUR 5 (A) 04/10/2021 0620   PROTEINUR NEGATIVE 04/10/2021 0620   NITRITE NEGATIVE 04/10/2021 0620   LEUKOCYTESUR NEGATIVE 04/10/2021 0620   LEUKOCYTESUR NEGATIVE 09/25/2013 1524   Results for orders placed or performed during the hospital encounter of 04/10/21  Resp Panel by RT-PCR (Flu A&B, Covid) Nasopharyngeal Swab     Status: None   Collection Time: 04/10/21  6:45 AM   Specimen: Nasopharyngeal Swab; Nasopharyngeal(NP) swabs in vial transport medium  Result Value Ref Range Status   SARS Coronavirus 2 by RT PCR NEGATIVE NEGATIVE Final    Comment: (NOTE) SARS-CoV-2 target nucleic acids are NOT DETECTED.  The SARS-CoV-2 RNA is generally detectable in upper respiratory specimens during the acute phase of infection. The lowest concentration of SARS-CoV-2 viral copies this assay can detect is 138 copies/mL. A negative result does not preclude SARS-Cov-2 infection and should not be used as the sole basis for treatment or other patient management decisions. A negative result may occur with  improper specimen collection/handling, submission of specimen other than nasopharyngeal swab, presence of viral mutation(s) within the areas targeted by this assay, and inadequate number of viral copies(<138 copies/mL). A  negative result must be combined with clinical observations, patient history, and epidemiological information. The expected result is Negative.  Fact Sheet for Patients:  BloggerCourse.com  Fact Sheet for Healthcare Providers:  SeriousBroker.it  This test is no t yet approved or cleared by the [place] FDA and  has been authorized for detection and/or diagnosis of SARS-CoV-2 by FDA under an Emergency Use Authorization (EUA). This EUA will remain  in effect (meaning this test can be used) for the duration of the COVID-19 declaration under Section 564(b)(1) of the Act, 21 U.S.C.section 360bbb-3(b)(1), unless the authorization is terminated  or revoked sooner.       Influenza A by PCR NEGATIVE NEGATIVE Final   Influenza B by PCR NEGATIVE NEGATIVE Final    Comment: (NOTE) The Xpert Xpress SARS-CoV-2/FLU/RSV plus assay is intended as an aid in the diagnosis of influenza from Nasopharyngeal swab specimens and should not be used as a sole basis for treatment. Nasal washings and aspirates are unacceptable for Xpert Xpress SARS-CoV-2/FLU/RSV testing.  Fact Sheet for Patients: BloggerCourse.com  Fact Sheet for Healthcare Providers: SeriousBroker.it  This test is not yet approved  or cleared by the [place] and has been authorized for detection and/or diagnosis of SARS-CoV-2 by FDA under an Emergency Use Authorization (EUA). This EUA will remain in effect (meaning this test can be used) for the duration of the COVID-19 declaration under Section 564(b)(1) of the Act, 21 U.S.C. section 360bbb-3(b)(1), unless the authorization is terminated or revoked.  Performed at [facility], [address], [place], [place] [address]    Assessment & Plan:  35 year old female with PMH nephrolithiasis currently with 2 proximal left ureteral stones with plans for ESWL in 2 days  who presented to the ED with uncontrollable pain, nausea, and vomiting.  Her symptoms have significantly improved since presentation after receiving Toradol.  Low suspicion for infection based on UA today.  Based on her symptomatic improvement, will plan for discharge with Flomax, Zofran, Percocet, and a low volume of p.o. Toradol to last her through the end of today.  We discussed that she should not take Toradol after this evening in advance of ESWL on Thursday.  She expressed understanding.  We discussed that if her refractory symptoms return prior to procedure, she should return to the emergency department and we may have to proceed with urgent ureteral stent placement.  Recommendations: -Continue Flomax, Zofran, Percocet, okay to take p.o. Toradol through the end of today -ESWL on Thursday -Return to the ED for refractory renal colic, nausea, or vomiting  Thank you for involving me in this patient's care, please page with any further questions or concerns.  Carman Ching, PA-C 04/10/2021 10:25 AM

## 2021-04-10 NOTE — ED Notes (Signed)
POCT Preg test - NEGATIVE.

## 2021-04-10 NOTE — ED Triage Notes (Signed)
Patient ambulatory to triage with steady gait, without difficulty or distress noted; pt reports dx with kidney stone on Sunday; c/o pain to left side/abd accomp by N/V unrelieved by percocet taken last night

## 2021-04-12 ENCOUNTER — Ambulatory Visit
Admission: RE | Admit: 2021-04-12 | Discharge: 2021-04-12 | Disposition: A | Discharge status: Home / Self Care | Payer: 59 | Attending: Urology | Admitting: Urology

## 2021-04-12 ENCOUNTER — Encounter: Payer: Self-pay | Admitting: Urology

## 2021-04-12 ENCOUNTER — Ambulatory Visit: Payer: 59

## 2021-04-12 ENCOUNTER — Encounter
Admission: RE | Disposition: A | Discharge status: Home / Self Care | Payer: Self-pay | Source: Home / Self Care | Attending: Urology

## 2021-04-12 ENCOUNTER — Other Ambulatory Visit: Payer: Self-pay

## 2021-04-12 DIAGNOSIS — Z885 Allergy status to narcotic agent status: Secondary | ICD-10-CM | POA: Diagnosis not present

## 2021-04-12 DIAGNOSIS — Z87442 Personal history of urinary calculi: Secondary | ICD-10-CM | POA: Insufficient documentation

## 2021-04-12 DIAGNOSIS — E669 Obesity, unspecified: Secondary | ICD-10-CM | POA: Diagnosis not present

## 2021-04-12 DIAGNOSIS — N201 Calculus of ureter: Secondary | ICD-10-CM | POA: Insufficient documentation

## 2021-04-12 HISTORY — PX: EXTRACORPOREAL SHOCK WAVE LITHOTRIPSY: SHX1557

## 2021-04-12 LAB — POCT PREGNANCY, URINE: Preg Test, Ur: NEGATIVE

## 2021-04-12 SURGERY — LITHOTRIPSY, ESWL
Anesthesia: Moderate Sedation | Laterality: Left

## 2021-04-12 MED ORDER — DIAZEPAM 5 MG PO TABS
ORAL_TABLET | ORAL | Status: AC
  Administered 2021-04-12: 10 mg via ORAL
  Filled 2021-04-12: qty 2

## 2021-04-12 MED ORDER — HYDROCODONE-ACETAMINOPHEN 5-325 MG PO TABS: 1.0000 | ORAL_TABLET | ORAL | 0 refills | Status: AC | PRN

## 2021-04-12 MED ORDER — DIPHENHYDRAMINE HCL 25 MG PO CAPS
25.0000 mg | ORAL_CAPSULE | ORAL | Status: AC
  Administered 2021-04-12: 25 mg via ORAL

## 2021-04-12 MED ORDER — CEPHALEXIN 500 MG PO CAPS: 500.0000 mg | ORAL_CAPSULE | Freq: Once | ORAL | Status: AC

## 2021-04-12 MED ORDER — TAMSULOSIN HCL 0.4 MG PO CAPS: 0.4000 mg | ORAL_CAPSULE | Freq: Every day | ORAL | 1 refills | Status: DC

## 2021-04-12 MED ORDER — SODIUM CHLORIDE 0.9 % IV SOLN: INTRAVENOUS | Status: DC

## 2021-04-12 MED ORDER — CEPHALEXIN 500 MG PO CAPS
ORAL_CAPSULE | ORAL | Status: AC
  Administered 2021-04-12: 500 mg via ORAL
  Filled 2021-04-12: qty 1

## 2021-04-12 MED ORDER — DIAZEPAM 5 MG PO TABS: 10.0000 mg | ORAL_TABLET | ORAL | Status: AC

## 2021-04-12 MED ORDER — ONDANSETRON HCL 4 MG/2ML IJ SOLN: 4.0000 mg | Freq: Once | INTRAMUSCULAR | Status: AC

## 2021-04-12 MED ORDER — DIPHENHYDRAMINE HCL 25 MG PO CAPS
ORAL_CAPSULE | ORAL | Status: AC
  Filled 2021-04-12: qty 1

## 2021-04-12 MED ORDER — ONDANSETRON HCL 4 MG/2ML IJ SOLN
INTRAMUSCULAR | Status: AC
  Administered 2021-04-12: 4 mg via INTRAVENOUS
  Filled 2021-04-12: qty 2

## 2021-04-12 NOTE — Brief Op Note (Signed)
04/12/2021  10:02 AM  PATIENT:  Rebecca Owens  35 y.o. female  PRE-OPERATIVE DIAGNOSIS:  Left proximal Ureteral Stone  POST-OPERATIVE DIAGNOSIS:  Same  PROCEDURE:  Procedure(s): EXTRACORPOREAL SHOCK WAVE LITHOTRIPSY (ESWL) (Left)  SURGEON:  Surgeon(s) and Role:    * Sondra Come, MD - Primary  ANESTHESIA: Conscious Sedation  EBL:  None  Drains: None  Specimen: None  Findings:  Excellent fragmentation of two proximal ureteral stones, tolerated well  DISPO: Flomax, pain meds PRN, RTC 2 weeks KUB  Legrand Rams, MD 04/12/2021

## 2021-04-12 NOTE — Discharge Instructions (Signed)

## 2021-04-12 NOTE — Interval H&P Note (Signed)
UROLOGY H&P UPDATE  Agree with prior H&P dated 04/10/21. Left proximal ureteral stone with poorly controlled renal colic, did not tolerate ureteral stent well previously.  Cardiac: RRR Lungs: CTA bilaterally  Laterality: left Procedure: left shockwave lithotripsy  Informed consent obtained, we specifically discussed the risks of bleeding/hematoma, infection, post-operative pain, obstructive fragments, possible need for additional procedures.  Sondra Come, MD 04/12/2021

## 2021-04-16 ENCOUNTER — Other Ambulatory Visit: Payer: Self-pay

## 2021-04-16 DIAGNOSIS — N201 Calculus of ureter: Secondary | ICD-10-CM

## 2021-05-01 NOTE — Progress Notes (Signed)
05/02/2021 1:20 PM   Rebecca Owens [facility] 02/15/1986 [id number]  Referring provider: Damaris Schooner, DO [address] [place],  [place] [address]  Chief Complaint  Patient presents with   Nephrolithiasis   Urological history: 1.  Nephrolithiasis -stone composition 100% calcium oxalate monohydrate -24 hour  low urine volume of 1.73 L, supersaturation of calcium oxalate 7.77, mildly elevated urinary oxalate of 41, normal urine calcium of 167, good urine citrate of 588, and urine sodium was normal at 129 -left urs 2021  HPI: Rebecca Owens is a 35 y.o. who is status post ESWL who presents today for follow up.  Underwent ESWL on 04/12/2021 for left proximal stone with Dr. Richardo Owens.  Their postprocedural course was as expected and uneventful.   They have passed fragments.    They did not bring in fragments for analysis.   KUB left proximal stones no longer visualized   UA negative for micro heme  PMH: Past Medical History:  Diagnosis Date   Abnormal uterine bleeding (AUB) 10/12/2019   History of kidney stones    Influenza A 09/09/2017   Kidney stone    Recurrent pregnancy loss 07/09/2019   S/P cholecystectomy 01/2008    Surgical History: Past Surgical History:  Procedure Laterality Date   CHOLECYSTECTOMY     CYSTOSCOPY/URETEROSCOPY/HOLMIUM LASER/STENT PLACEMENT Left 10/15/2019   Procedure: CYSTOSCOPY/URETEROSCOPY/HOLMIUM LASER/STENT PLACEMENT;  Surgeon: Rebecca Come, MD;  Location: [facility];  Service: Urology;  Laterality: Left;   DILATION AND EVACUATION N/A 05/01/2018   Procedure: DILATATION AND EVACUATION;  Surgeon: Rebecca Douglas, MD;  Location: [facility] OR[facility];  Service: Gynecology;  Laterality: N/A;   EXTRACORPOREAL SHOCK WAVE LITHOTRIPSY Left 04/12/2021   Procedure: EXTRACORPOREAL SHOCK WAVE LITHOTRIPSY (ESWL);  Surgeon: Rebecca Come, MD;  Location: [facility] OR[facility];  Service: Urology;  Laterality: Left;    Home Medications:  Current Outpatient Medications on File  Prior to Visit  Medication Sig Dispense Refill   docusate sodium (COLACE) 100 MG capsule Take 1 capsule (100 mg total) by mouth 2 (two) times daily. 10 capsule 0   Prenatal Vit-Fe Fumarate-FA (PRENATAL MULTIVITAMIN) TABS tablet Take 1 tablet by mouth at bedtime.     acetaminophen (TYLENOL) 500 MG tablet Take 2 tablets (1,000 mg total) by mouth every 6 (six) hours as needed (for pain scale < 4). (Patient not taking: No sig reported) 30 tablet 0   benzocaine-Menthol (DERMOPLAST) 20-0.5 % AERO Apply 1 application topically as needed for irritation (perineal discomfort). (Patient not taking: No sig reported)     cetirizine (ZYRTEC) 10 MG tablet Take 10 mg by mouth daily. (Patient not taking: No sig reported)     coconut oil OIL Apply 1 application topically as needed. (Patient not taking: No sig reported)  0   famotidine (PEPCID) 20 MG tablet Take by mouth. (Patient not taking: No sig reported)     HYDROcodone-acetaminophen (NORCO) 5-325 MG tablet Take 1-2 tablets by mouth every 4 (four) hours as needed for moderate pain. (Patient not taking: Reported on 05/02/2021) 15 tablet 0   ibuprofen (ADVIL) 600 MG tablet Take 1 tablet (600 mg total) by mouth every 6 (six) hours. (Patient not taking: No sig reported) 30 tablet 0   ketorolac (TORADOL) 10 MG tablet Take 1 tablet (10 mg total) by mouth every 6 (six) hours as needed. (Patient not taking: Reported on 05/02/2021) 3 tablet 0   omeprazole (PRILOSEC) 10 MG capsule Take by mouth. (Patient not taking: No sig reported)     ondansetron (  ZOFRAN ODT) 4 MG disintegrating tablet Take 1 tablet (4 mg total) by mouth every 8 (eight) hours as needed for nausea or vomiting. (Patient not taking: Reported on 05/02/2021) 20 tablet 0   phentermine (ADIPEX-P) 37.5 MG tablet Take 37.5 mg by mouth every morning.     tamsulosin (FLOMAX) 0.4 MG CAPS capsule Take 1 capsule (0.4 mg total) by mouth daily after supper. (Patient not taking: Reported on 05/02/2021) 14 capsule 1   witch  hazel-glycerin (TUCKS) pad Apply 1 application topically continuous. (Patient not taking: No sig reported) 40 each 12   No current facility-administered medications on file prior to visit.    Allergies:  Allergies  Allergen Reactions   Codeine Hives    Family History: Family History  Problem Relation Age of Onset   Hyperlipidemia Father    Heart disease Paternal Grandfather     Social History:  reports that she has never smoked. She has never used smokeless tobacco. She reports that she does not currently use alcohol. She reports that she does not use drugs.  ROS: Pertinent ROS in HPI  Physical Exam: BP 133/84   Pulse (!) 112   Ht 5\' 5"  (1.651 m)   Wt 259 lb (117.5 kg)   LMP 04/06/2021 (Exact Date) Comment: on birth control  BMI 43.10 kg/m   Constitutional:  Well nourished. Alert and oriented, No acute distress. HEENT: [place] AT, mask in place.   Trachea midline. Cardiovascular: No clubbing, cyanosis, or edema. Respiratory: Normal respiratory effort, no increased work of breathing. Neurologic: Grossly intact, no focal deficits, moving all 4 extremities. Psychiatric: Normal mood and affect.  Laboratory Data: Lab Results  Component Value Date   WBC 11.2 (H) 04/10/2021   HGB 13.9 04/10/2021   HCT 40.2 04/10/2021   MCV 90.7 04/10/2021   PLT 219 04/10/2021    Lab Results  Component Value Date   CREATININE 1.17 (H) 04/10/2021    Urinalysis Component     Latest Ref Rng & Units 05/02/2021  Specific Gravity, UA     1.005 - 1.030 1.015  pH, UA     5.0 - 7.5 6.5  Color, UA     Yellow Yellow  Appearance Ur     Clear Cloudy (A)  Leukocytes,UA     Negative Negative  Protein,UA     Negative/Trace Negative  Glucose, UA     Negative Negative  Ketones, UA     Negative Negative  RBC, UA     Negative Negative  Bilirubin, UA     Negative Negative  Urobilinogen, Ur     0.2 - 1.0 mg/dL 0.2  Nitrite, UA     Negative Negative  Microscopic Examination      See below:    Component     Latest Ref Rng & Units 05/02/2021          WBC, UA     0 - 5 /hpf 0-5  RBC     0 - 2 /hpf 0-2  Epithelial Cells (non renal)     0 - 10 /hpf 0-10  Bacteria, UA     None seen/Few Many (A)  I have reviewed the labs.   Pertinent Imaging: CLINICAL DATA:  Renal stone   EXAM: ABDOMEN - 1 VIEW   COMPARISON:  04/12/2021   FINDINGS: There are 2-3 small calcific densities in the midportion of left kidney. There are faint linear calcifications overlying the upper poles of both kidneys, possibly small sclerotic foci in the bony structures.  Previously seen calculi in the course of the left ureter are not evident. Bowel gas pattern is nonspecific. Surgical clips are seen in gallbladder fossa.   IMPRESSION: Previously seen calculi in the course of left ureter are not evident. There are few small left renal stones.     Electronically Signed   By: Elmer Picker M.D.   On: 05/03/2021 15:30 I have independently reviewed the films.  See HPI.   Assessment & Plan:    1. Left ureteral stone -KUB stone not visualized  2. Microscopic hematuria - UA today negative for micro heme    Return in about 1 year (around 05/02/2022) for KUB and follow up .  These notes generated with voice recognition software. I apologize for typographical errors.  Zara Council, PA-C  [facility] [address]  [place], [place] [address] [phone]

## 2021-05-02 ENCOUNTER — Ambulatory Visit
Admission: RE | Admit: 2021-05-02 | Discharge: 2021-05-02 | Disposition: A | Discharge status: Home / Self Care | Payer: 59 | Source: Ambulatory Visit | Attending: Urology | Admitting: Urology

## 2021-05-02 ENCOUNTER — Ambulatory Visit (INDEPENDENT_AMBULATORY_CARE_PROVIDER_SITE_OTHER): Payer: 59 | Admitting: Urology

## 2021-05-02 ENCOUNTER — Encounter: Payer: Self-pay | Admitting: Urology

## 2021-05-02 ENCOUNTER — Other Ambulatory Visit: Payer: Self-pay

## 2021-05-02 VITALS — BP 133/84 | HR 112 | Ht 65.0 in | Wt 259.0 lb

## 2021-05-02 DIAGNOSIS — R3129 Other microscopic hematuria: Secondary | ICD-10-CM

## 2021-05-02 DIAGNOSIS — N201 Calculus of ureter: Secondary | ICD-10-CM | POA: Insufficient documentation

## 2021-05-02 DIAGNOSIS — N200 Calculus of kidney: Secondary | ICD-10-CM

## 2021-05-02 LAB — URINALYSIS, COMPLETE
Bilirubin, UA: NEGATIVE
Glucose, UA: NEGATIVE
Ketones, UA: NEGATIVE
Leukocytes,UA: NEGATIVE
Nitrite, UA: NEGATIVE
Protein,UA: NEGATIVE
RBC, UA: NEGATIVE
Specific Gravity, UA: 1.015 (ref 1.005–1.030)
Urobilinogen, Ur: 0.2 mg/dL (ref 0.2–1.0)
pH, UA: 6.5 (ref 5.0–7.5)

## 2021-05-02 LAB — MICROSCOPIC EXAMINATION

## 2021-05-30 ENCOUNTER — Ambulatory Visit: Payer: Self-pay | Admitting: Urology

## 2022-05-21 ENCOUNTER — Other Ambulatory Visit: Payer: Self-pay | Admitting: *Deleted

## 2022-05-21 DIAGNOSIS — N200 Calculus of kidney: Secondary | ICD-10-CM

## 2022-05-22 ENCOUNTER — Ambulatory Visit: Payer: 59 | Admitting: Urology

## 2022-05-22 ENCOUNTER — Encounter: Payer: Self-pay | Admitting: Urology

## 2022-05-22 ENCOUNTER — Ambulatory Visit
Admission: RE | Admit: 2022-05-22 | Discharge: 2022-05-22 | Disposition: A | Discharge status: Home / Self Care | Payer: 59 | Source: Ambulatory Visit | Attending: Urology | Admitting: Urology

## 2022-05-22 ENCOUNTER — Ambulatory Visit
Admission: RE | Admit: 2022-05-22 | Discharge: 2022-05-22 | Disposition: A | Discharge status: Home / Self Care | Payer: 59 | Attending: Urology | Admitting: Urology

## 2022-05-22 VITALS — BP 112/78 | HR 73 | Ht 66.0 in | Wt 246.0 lb

## 2022-05-22 DIAGNOSIS — N200 Calculus of kidney: Secondary | ICD-10-CM

## 2022-05-22 DIAGNOSIS — Z87442 Personal history of urinary calculi: Secondary | ICD-10-CM

## 2022-05-22 NOTE — Patient Instructions (Signed)

## 2022-05-22 NOTE — Progress Notes (Signed)
   05/22/2022 11:48 AM   Denny Peon Adelene Amas [birth year] [id number]  Reason for visit: Follow up recurrent nephrolithiasis  HPI: 36 year old female with history of calcium oxalate stones requiring left ureteroscopy in April 2021, and left shockwave lithotripsy in October 2022.  24-hour urine previously was notable for low urine volume of 1.73, mildly elevated urinary oxalate of 41, normal urine calcium 167, excellent citrate 588, normal sodium 129.  Ureteroscopy in 2021 was challenging requiring balloon dilation of the ureter to access a mid ureteral stone as well as the left renal stone, and her left renal stone was located within a narrow infundibulum that required infundibulotomy with the laser to access the stone.  She denies any stone episodes over the last year and has been doing well.  She has been working on increasing fluid intake, and uses the diet lemonade packets for citrate supplementation.  Personally viewed and interpreted her KUB today that shows stable small nonobstructive left renal stones including 3 mm in the upper pole, and 2 mm in the lower pole.  We discussed general stone prevention strategies including adequate hydration with goal of producing 2.5 L of urine daily, increasing citric acid intake, increasing calcium intake during high oxalate meals, minimizing animal protein, and decreasing salt intake. Information about dietary recommendations given today.   RTC 1 year KUB   Sondra Come, MD  [facility] [address], [address] [place], [place] [address] [phone]

## 2022-05-30 ENCOUNTER — Ambulatory Visit: Payer: 59 | Admitting: Urology

## 2022-07-29 IMAGING — CR DG ABDOMEN 1V
2 series · 2 of 2 positions shown · non-contrast
Comparison: 04/12/2021

CLINICAL DATA: Renal stone

EXAM:
ABDOMEN - 1 VIEW

[abdomen kub (1 of 2)]
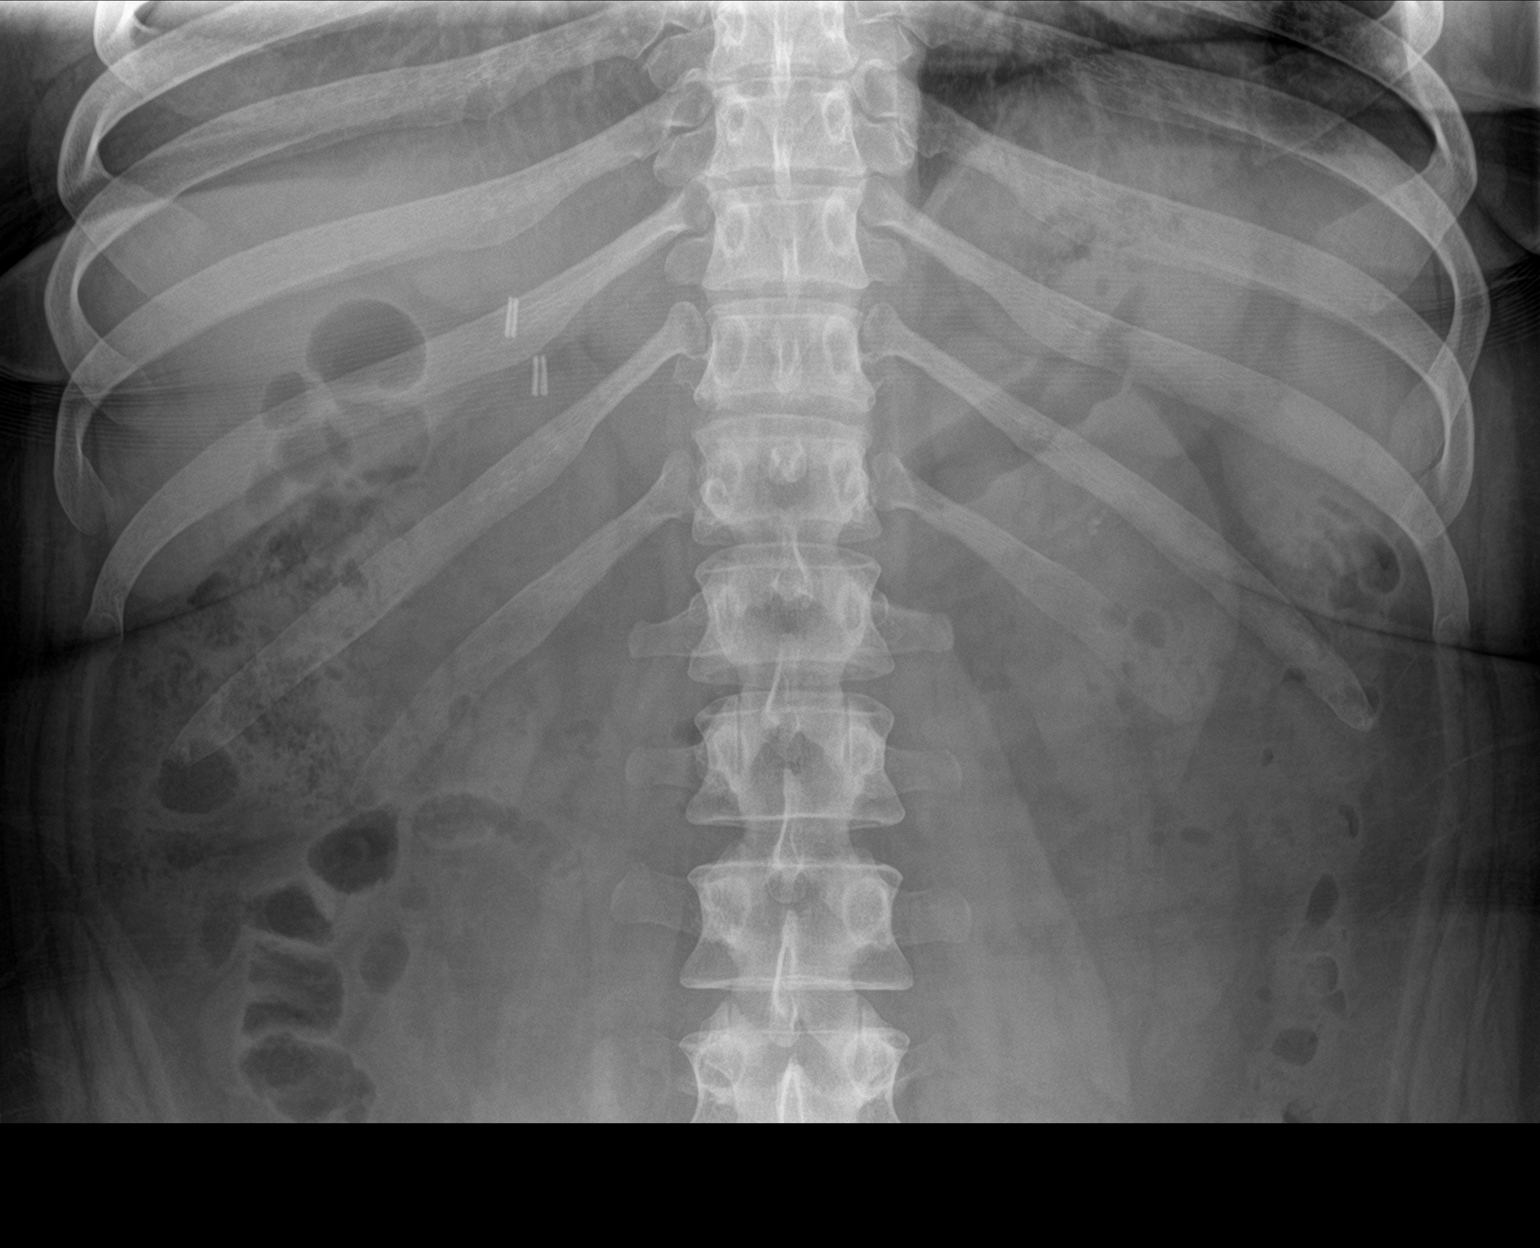

[abdomen kub (2 of 2)]
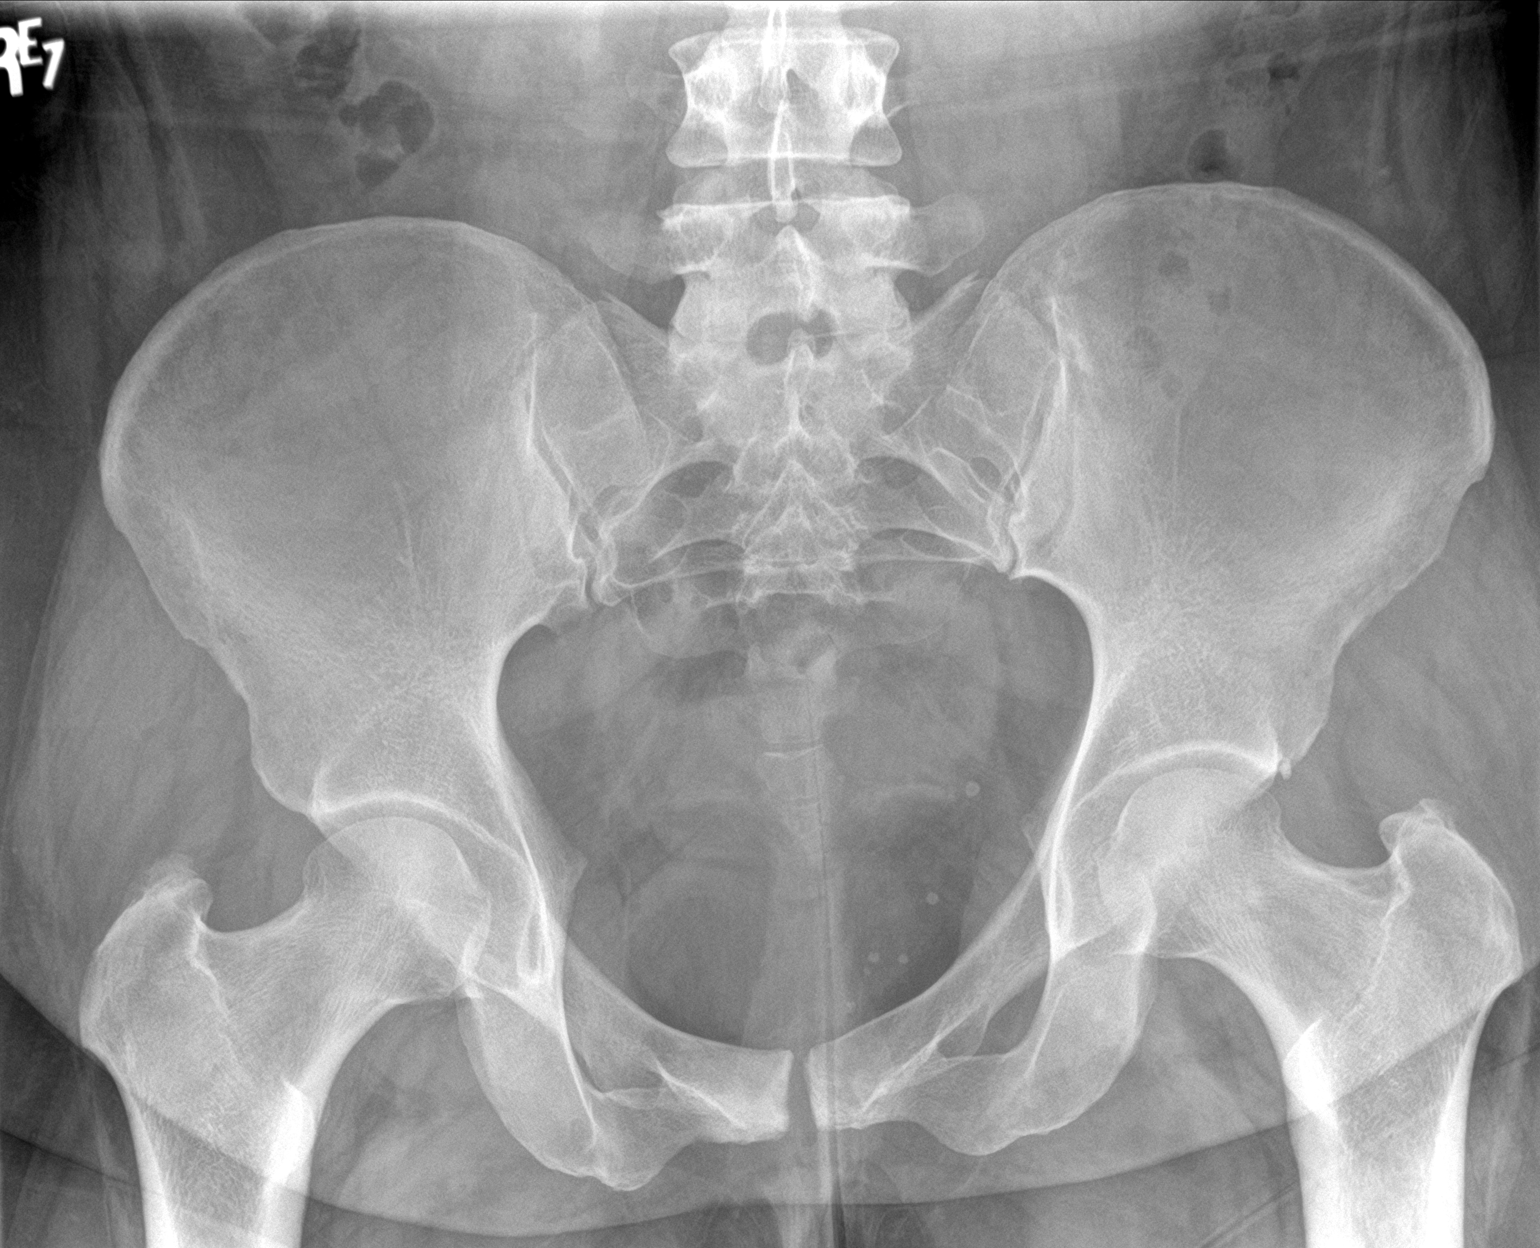

[2 of 2 positions shown; findings below may reference images not displayed]

FINDINGS: There are 2-3 small calcific densities in the midportion of left
kidney. There are faint linear calcifications overlying the upper
poles of both kidneys, possibly small sclerotic foci in the bony
structures. Previously seen calculi in the course of the left ureter
are not evident. Bowel gas pattern is nonspecific. Surgical clips
are seen in gallbladder fossa.
IMPRESSION: Previously seen calculi in the course of left ureter are not
evident. There are few small left renal stones.

## 2023-05-15 ENCOUNTER — Other Ambulatory Visit: Payer: Self-pay | Admitting: Orthopedic Surgery

## 2023-05-15 DIAGNOSIS — M25852 Other specified joint disorders, left hip: Secondary | ICD-10-CM

## 2023-05-27 ENCOUNTER — Ambulatory Visit
Admission: RE | Admit: 2023-05-27 | Discharge: 2023-05-27 | Disposition: A | Discharge status: Home / Self Care | Payer: 59 | Attending: Urology | Admitting: Urology

## 2023-05-27 ENCOUNTER — Other Ambulatory Visit: Admission: RE | Admit: 2023-05-27 | Payer: 59 | Source: Home / Self Care

## 2023-05-27 ENCOUNTER — Ambulatory Visit: Payer: 59 | Admitting: Urology

## 2023-05-27 ENCOUNTER — Encounter: Payer: Self-pay | Admitting: Urology

## 2023-05-27 ENCOUNTER — Ambulatory Visit
Admission: RE | Admit: 2023-05-27 | Discharge: 2023-05-27 | Disposition: A | Discharge status: Home / Self Care | Payer: 59 | Source: Ambulatory Visit | Attending: Urology

## 2023-05-27 ENCOUNTER — Other Ambulatory Visit: Payer: Self-pay | Admitting: *Deleted

## 2023-05-27 VITALS — BP 120/84 | HR 84 | Ht 65.0 in | Wt 221.0 lb

## 2023-05-27 DIAGNOSIS — N200 Calculus of kidney: Secondary | ICD-10-CM | POA: Insufficient documentation

## 2023-05-27 DIAGNOSIS — Z87442 Personal history of urinary calculi: Secondary | ICD-10-CM

## 2023-05-27 DIAGNOSIS — Z09 Encounter for follow-up examination after completed treatment for conditions other than malignant neoplasm: Secondary | ICD-10-CM

## 2023-05-27 NOTE — Progress Notes (Signed)
   05/27/2023 11:39 AM   Denny Peon Adelene Amas 11-26-1985 [id number]  Reason for visit: Follow up recurrent nephrolithiasis  HPI: 36 year old female with history of calcium oxalate stones requiring left ureteroscopy in April 2021, and left shockwave lithotripsy in October 2022.  24-hour urine previously was notable for low urine volume of 1.73, mildly elevated urinary oxalate of 41, normal urine calcium 167, excellent citrate 588, normal sodium 129.  Ureteroscopy in 2021 was challenging requiring balloon dilation of the ureter to access a mid ureteral stone as well as the left renal stone, and her left renal stone was located within a narrow infundibulum that required infundibulotomy with the laser to access the stone.  She thinks she may have passed 1 small stone on the right side earlier this year.  I personally viewed and interpreted her KUB today shows stable small left-sided renal stones, new 3 mm right renal stone.  She was recently started on Topamax, I recommended discontinuing that medication we discussed the increased risk of kidney stones.  We discussed general stone prevention strategies including adequate hydration with goal of producing 2.5 L of urine daily, increasing citric acid intake, increasing calcium intake during high oxalate meals, minimizing animal protein, and decreasing salt intake. Information about dietary recommendations given today.   RTC 1 year KUB   Sondra Come, MD  [facility] [address] [place], [place] [address] [phone]

## 2023-05-28 ENCOUNTER — Encounter: Payer: Self-pay | Admitting: Orthopedic Surgery

## 2023-06-04 ENCOUNTER — Ambulatory Visit
Admission: RE | Admit: 2023-06-04 | Discharge: 2023-06-04 | Disposition: A | Discharge status: Home / Self Care | Payer: 59 | Source: Ambulatory Visit | Attending: Orthopedic Surgery | Admitting: Orthopedic Surgery

## 2023-06-04 DIAGNOSIS — M25852 Other specified joint disorders, left hip: Secondary | ICD-10-CM

## 2023-06-04 MED ORDER — IOPAMIDOL (ISOVUE-M 200) INJECTION 41%
10.0000 mL | Freq: Once | INTRAMUSCULAR | Status: AC
  Administered 2023-06-04: 10 mL via INTRA_ARTICULAR

## 2023-09-04 ENCOUNTER — Other Ambulatory Visit: Payer: Self-pay | Admitting: Orthopedic Surgery

## 2023-09-04 DIAGNOSIS — M5416 Radiculopathy, lumbar region: Secondary | ICD-10-CM

## 2023-09-09 ENCOUNTER — Encounter: Payer: Self-pay | Admitting: Orthopedic Surgery

## 2023-09-11 ENCOUNTER — Ambulatory Visit
Admission: RE | Admit: 2023-09-11 | Discharge: 2023-09-11 | Disposition: A | Discharge status: Home / Self Care | Source: Ambulatory Visit | Attending: Orthopedic Surgery | Admitting: Orthopedic Surgery

## 2023-09-11 DIAGNOSIS — M5416 Radiculopathy, lumbar region: Secondary | ICD-10-CM

## 2024-05-06 ENCOUNTER — Encounter: Payer: Self-pay | Admitting: Urology

## 2024-05-17 ENCOUNTER — Other Ambulatory Visit: Payer: Self-pay

## 2024-05-17 DIAGNOSIS — N200 Calculus of kidney: Secondary | ICD-10-CM

## 2024-05-25 ENCOUNTER — Ambulatory Visit
Admission: RE | Admit: 2024-05-25 | Discharge: 2024-05-25 | Disposition: A | Source: Ambulatory Visit | Attending: Urology | Admitting: Urology

## 2024-05-25 ENCOUNTER — Ambulatory Visit: Payer: Self-pay | Admitting: Urology

## 2024-05-25 VITALS — BP 105/63 | HR 90 | Wt 201.0 lb

## 2024-05-25 DIAGNOSIS — N200 Calculus of kidney: Secondary | ICD-10-CM

## 2024-05-25 MED ORDER — TAMSULOSIN HCL 0.4 MG PO CAPS: 0.4000 mg | ORAL_CAPSULE | Freq: Every day | ORAL | 1 refills | Status: AC

## 2024-05-25 NOTE — Patient Instructions (Signed)
 SABRA

## 2024-05-25 NOTE — Progress Notes (Signed)
   05/25/2024 11:18 AM   Rebecca Owens [facility] 30-Oct-1985 [id number]  Reason for visit: Follow up nephrolithiasis  History: History of calcium oxalate stones Required ureteroscopy April 2021, challenging secondary to tight ureter requiring balloon dilation Shockwave lithotripsy October 2022  Physical Exam: BP 105/63 (BP Location: Left Arm, Patient Position: Sitting, Cuff Size: Normal)   Pulse 90   Wt 201 lb (91.2 kg)   SpO2 97%   BMI 33.45 kg/m    Imaging/labs: Prior 24-hour urine results notable for low urine volume of 1.73, mildly elevated urinary oxalate of 41, normal urine calcium 167, excellent citrate 588, normal sodium 129.  I personally viewed and interpreted KUB today that shows stable small stones bilaterally, no significant change from last year  Today: She denies any stone events over the last year, has discontinued topiramate, overall doing well  Plan:   Nephrolithiasis: Stone burden stable on KUB today, continue surveillance.  Return precautions were discussed.  Flomax  sent in in case she does have passage of a stone. We discussed general stone prevention strategies including adequate hydration with goal of producing 2.5 L of urine daily, increasing citric acid  intake, increasing calcium intake during high oxalate meals, minimizing animal protein, and decreasing salt intake. Information about dietary recommendations given today.  RTC 1 year KUB prior   Rebecca Owens Burnet, MD  [facility] [address] [place], [place] [address] [phone]

## 2024-06-01 NOTE — Progress Notes (Signed)
 This video encounter was conducted with the patient's (or proxy's) verbal consent via secure, interactive audio and video telecommunications while in clinic/office/hospital.  The patient (or proxy) was instructed to have this encounter in a suitably private space and to only have persons present to whom they give permission to participate. In addition, patient identity was confirmed by use of name plus an additional identifier.  This visit was coded based on medical decision making (MDM).  [facility] Return Visit Initial visit 8/29 @ 260#  Subjective: Rebecca Owens is following an intensive nutritional, exercise, and behavioral lifestyle program. She feels well. Reports hunger: no. Satisfied with foods: yes. 24-hour food recall reviewed. Adherence good.   B: Premier protein shake Or bacon/sausage and eggs L: chicken salad on low carb tortillas or grilled chicken salad D: meat (chicken) with beans  S: gummies, cookies  Bev: water, tea with splenda (has been decreasing splenda)  Activity: back pain is limiting exercise; had another steroid shot the week before Thanksgiving Went to [place] and did a lot of walking   General: Energy: excellent.  GI: Constipation: no.  Musculoskeletal: Muscle cramps: no.  Neurological: Headaches: yes. Improving with Topamax   Lightheaded upon standing:  no.   PHYSICAL EXAMINATION:    Wt Readings from Last 5 Encounters:  06/01/24 90.3 kg (199 lb)  05/12/24 94.8 kg (209 lb)  05/10/24 91.2 kg (201 lb 1 oz)  04/26/24 91.2 kg (201 lb 1 oz)  04/06/24 91.2 kg (201 lb)   Wt 90.3 kg (199 lb)   BMI 33.12 kg/m  Constitutional: alert, interactive with provider, cooperative, in no distress Mental status: good historian, appropriate mood and behavior, thought content appears normal Respiratory: no respiratory distress, no audible wheezing, speaking in full sentences    LABS/Studies Lab Results  Component Value Date   WBC 6.8  04/11/2023   HGB 13.5 08/27/2023   HCT 36.1 04/11/2023   MCV 93 04/11/2023   PLT 222 04/11/2023     Chemistry      Component Value Date/Time   NA 138 08/27/2023 1027   NA 137 09/17/2022 1631   K 4.7 08/27/2023 1027   K 4.1 09/17/2022 1631   CL 105 08/27/2023 1027   CL 103 09/17/2022 1631   CO2 28.8 08/27/2023 1027   CO2 24 09/17/2022 1631   BUN 20 08/27/2023 1027   BUN 17 09/17/2022 1631   CREATININE 0.9 08/27/2023 1027   CREATININE 0.9 09/17/2022 1631   GLUCOSE 97 08/27/2023 1027   GLUCOSE 79 09/17/2022 1631      Component Value Date/Time   CALCIUM 9.5 08/27/2023 1027   CALCIUM 9.4 09/17/2022 1631   ALKPHOS 71 08/27/2023 1027   ALKPHOS 64 09/17/2022 1631   AST 27 08/27/2023 1027   AST 25 09/17/2022 1631   ALT 28 08/27/2023 1027   ALT 28 09/17/2022 1631   TBILI 0.5 08/27/2023 1027   TBILI 0.2 (L) 09/17/2022 1631     Lab Results  Component Value Date   HGBA1C 5.1 08/27/2023   HGBA1C 5.2 09/17/2022   Last Lipids: Lab Results  Component Value Date   CHOLTOTAL 215 (H) 08/27/2023   LDLCALC 148 (H) 08/27/2023   HDL 48.2 08/27/2023   TRIG 95 08/27/2023   Lab Results  Component Value Date   TSH 0.886 08/27/2023      Assessment/Plan of Treatment:    -Abnormal weight gain/Overweight or Obesity: Adherence reinforced for continued weight loss.   Patient's goals for next  visit: Nutrition: continue low carb diet;   Activity: do as tolerated Behavioral: Supportive counseling, positive reinforcement, MI, goal-setting.  Rx: stopped Phentermine 37.5 mg; Topamax 50 mg -no side effects, but ineffective -On Zepbound 12.5 mg some side effects since she increase. No refills sent  All side effects were discussed with patients and questions/concerns were answered   Hyperlipidemia-no medication at this time; continue weight loss efforts; levels have improved!  LBP/Hip pain-has tearstopped PT -on Cymbalta -did a strong 12 day steroid taper, which helped, but had recent  flare last week - injections    RTC 6-8 weeks to monitor adherence and medications.  I spent a total of 20 minutes in both face-to-face and non-face-to-face activities, excluding procedures performed, for this visit on the date of this encounter.  Greater than 50% of the visit was spent in counseling.  ELIZABETH ASHLEY HOLLER, PA-C

## 2024-06-03 ENCOUNTER — Ambulatory Visit: Admitting: Urology

## 2024-06-03 ENCOUNTER — Ambulatory Visit: Admitting: Physician Assistant

## 2024-06-03 ENCOUNTER — Ambulatory Visit
Admission: RE | Admit: 2024-06-03 | Discharge: 2024-06-03 | Disposition: A | Discharge status: Home / Self Care | Attending: Urology | Admitting: Urology

## 2024-06-03 ENCOUNTER — Ambulatory Visit
Admission: RE | Admit: 2024-06-03 | Discharge: 2024-06-03 | Disposition: A | Discharge status: Home / Self Care | Source: Ambulatory Visit | Attending: Urology | Admitting: Urology

## 2024-06-03 VITALS — BP 107/71 | HR 102 | Ht 65.0 in | Wt 201.0 lb

## 2024-06-03 DIAGNOSIS — N200 Calculus of kidney: Secondary | ICD-10-CM

## 2024-06-03 MED ORDER — ONDANSETRON HCL 4 MG PO TABS: 4.0000 mg | ORAL_TABLET | Freq: Three times a day (TID) | ORAL | 0 refills | Status: AC | PRN

## 2024-06-03 MED ORDER — TRAMADOL HCL 50 MG PO TABS: 25.0000 mg | ORAL_TABLET | Freq: Four times a day (QID) | ORAL | 0 refills | Status: AC | PRN

## 2024-06-03 NOTE — Patient Instructions (Signed)
 ESWL for Kidney Stones  Extracorporeal shock wave lithotripsy (ESWL) is a treatment that can help break up kidney stones that are too large to pass on their own.  This is a nonsurgical procedure that breaks up a kidney stone with shock waves. These shock waves pass through your body and focus on the kidney stone. They cause the kidney stone to break into smaller pieces (fragments) while it is still in the urinary tract. The fragments of stone can pass more easily out of your body in the pee (urine). Tell a health care provider about: Any allergies you have. All medicines you are taking, including vitamins, herbs, eye drops, creams, and over-the-counter medicines. Any problems you or family members have had with anesthetic medicines. Any bleeding problems you have. Any surgeries you've had. Any medical conditions you have. Whether you're pregnant or may be pregnant. What are the risks? Your health care provider will talk with you about risks. These may include: Infection. Bleeding from the kidney. Bruising of the kidney or skin. Scarring of the kidney. This can lead to: Increased blood pressure. Poor kidney function. Return (recurrence) of kidney stones. Damage to other structures or organs. This may include the liver, colon, spleen, or pancreas. Blockage (obstruction) of the tube that carries pee from the kidney to the bladder (ureter). Failure of the kidney stone to break into fragments. What happens before the procedure? When to stop eating and drinking Follow instructions from your health care provider about what you may eat and drink. These may include: 8 hours before your procedure Stop eating most foods. Do not eat meat, fried foods, or fatty foods. Eat only light foods, such as toast or crackers. All liquids are okay except energy drinks and alcohol. 6 hours before your procedure Stop eating. Drink only clear liquids, such as water, clear fruit juice, black coffee, plain tea,  and sports drinks. Do not drink energy drinks or alcohol. 2 hours before your procedure Stop drinking all liquids. You may be allowed to take medicines with small sips of water. If you do not follow your health care provider's instructions, your procedure may be delayed or canceled. Medicines Ask your health care provider about: Changing or stopping your regular medicines. These include any diabetes medicines or blood thinners you take. Taking medicines such as aspirin and ibuprofen. These medicines can thin your blood. Do not take them unless your health care provider tells you to. Taking over-the-counter medicines, vitamins, herbs, and supplements. Tests You may have tests, such as: Blood tests. Pee (urine) tests. Imaging tests. This may include a CT scan. Surgery safety Ask your health care provider: How your surgery site will be marked. What steps will be taken to help prevent infection. These steps may include: Washing skin with a soap that kills germs. Receiving antibiotics. General instructions If you will be going home right after the procedure, plan to have a responsible adult: Take you home from the hospital or clinic. You will not be allowed to drive. Care for you for the time you are told. What happens during the procedure?  An IV will be inserted into one of your veins. You may be given: A sedative. This helps you relax. Anesthesia. This will: Numb certain areas of your body. Make you fall asleep for surgery. A water-filled cushion may be placed behind your kidney or on your abdomen. In some cases, you may be placed in a tub of lukewarm water. Your body will be positioned in a way that makes it  easier to target the kidney stone. An X-ray or ultrasound exam will be done to locate your stone. Shock waves will be aimed at the stone. If you are awake, you may feel a tapping sensation as the shock waves pass through your body. A small mesh tube (stent) may be placed in  your ureter. This will help keep pee flowing from the kidney if the fragments of the stone have been blocking the ureter. The stent will be removed at a later time by your health care provider. The procedure may vary among health care providers and hospitals. What happens after the procedure? Your blood pressure, heart rate, breathing rate, and blood oxygen level will be monitored until you leave the hospital or clinic. You may have an X-ray after the procedure to see how many of the kidney stones were broken up. This will also show how much of the stone has passed. If there are still large fragments after treatment, you may need to have a second procedure at a later time. This information is not intended to replace advice given to you by your health care provider. Make sure you discuss any questions you have with your health care provider. Document Revised: 12/21/2022 Document Reviewed: 10/11/2021 Elsevier Patient Education  2024 [employer].

## 2024-06-03 NOTE — Progress Notes (Signed)
° °  06/03/2024 3:15 PM   Rebecca Owens 27-Sep-1985 [id number]  Reason for visit: Right flank pain, gross hematuria, history of nephrolithiasis  History: History of calcium oxalate stones, ureteroscopy April 2021 challenging with tight ureter requiring balloon dilation, did much better with shockwave lithotripsy October 2022 Recently seen 05/25/2024 and was doing well, stable nonobstructing stones bilaterally on KUB  Physical Exam: BP 107/71 (BP Location: Left Arm, Patient Position: Sitting, Cuff Size: Normal)   Pulse (!) 102   Ht 5' 5 (1.651 m)   Wt 201 lb (91.2 kg)   SpO2 98%   BMI 33.45 kg/m   Imaging/labs: Urinalysis today with greater than 30 RBC but otherwise benign I personally viewed and interpreted the KUB that appears to show migration of the 5 mm right upper pole stone to the right UPJ  Today: Reports a week of intermittent right flank pain, has not been to severe, but she does take meloxicam at baseline for back pain.  She denies any fevers or chills.  Some mild urinary symptoms of frequency, mild nausea.  Gross hematuria.  Plan:   Right ureteral stone: KUB today consistent with 5 mm right proximal ureteral stone. We discussed various treatment options for urolithiasis including observation with or without medical expulsive therapy, shockwave lithotripsy (SWL), ureteroscopy and laser lithotripsy with stent placement, and percutaneous nephrolithotomy.  Based on her prior experiences, she prefers shockwave lithotripsy. Continue Flomax  for medical expulsive therapy, tramadol sent in, Zofran .  Schedule right shockwave lithotripsy(proximal stone will need to hold NSAIDs)   Redell JAYSON Burnet, MD  [facility] [address], [address] [place], [place] [address] [phone]

## 2024-06-03 NOTE — H&P (View-Only) (Signed)
° °  06/03/2024 3:15 PM   Rebecca Owens 27-Sep-1985 [id number]  Reason for visit: Right flank pain, gross hematuria, history of nephrolithiasis  History: History of calcium oxalate stones, ureteroscopy April 2021 challenging with tight ureter requiring balloon dilation, did much better with shockwave lithotripsy October 2022 Recently seen 05/25/2024 and was doing well, stable nonobstructing stones bilaterally on KUB  Physical Exam: BP 107/71 (BP Location: Left Arm, Patient Position: Sitting, Cuff Size: Normal)   Pulse (!) 102   Ht 5' 5 (1.651 m)   Wt 201 lb (91.2 kg)   SpO2 98%   BMI 33.45 kg/m   Imaging/labs: Urinalysis today with greater than 30 RBC but otherwise benign I personally viewed and interpreted the KUB that appears to show migration of the 5 mm right upper pole stone to the right UPJ  Today: Reports a week of intermittent right flank pain, has not been to severe, but she does take meloxicam at baseline for back pain.  She denies any fevers or chills.  Some mild urinary symptoms of frequency, mild nausea.  Gross hematuria.  Plan:   Right ureteral stone: KUB today consistent with 5 mm right proximal ureteral stone. We discussed various treatment options for urolithiasis including observation with or without medical expulsive therapy, shockwave lithotripsy (SWL), ureteroscopy and laser lithotripsy with stent placement, and percutaneous nephrolithotomy.  Based on her prior experiences, she prefers shockwave lithotripsy. Continue Flomax  for medical expulsive therapy, tramadol sent in, Zofran .  Schedule right shockwave lithotripsy(proximal stone will need to hold NSAIDs)   Redell JAYSON Burnet, MD  [facility] [address], [address] [place], [place] [address] [phone]

## 2024-06-04 ENCOUNTER — Other Ambulatory Visit: Payer: Self-pay

## 2024-06-04 DIAGNOSIS — N201 Calculus of ureter: Secondary | ICD-10-CM

## 2024-06-04 LAB — MICROSCOPIC EXAMINATION: RBC, Urine: >30 /HPF — AB (ref 0–2)

## 2024-06-04 LAB — URINALYSIS, COMPLETE
Bilirubin, UA: NEGATIVE
Glucose, UA: NEGATIVE
Ketones, UA: NEGATIVE
Leukocytes,UA: NEGATIVE
Nitrite, UA: NEGATIVE
Specific Gravity, UA: 1.03 (ref 1.005–1.030)
Urobilinogen, Ur: 0.2 mg/dL (ref 0.2–1.0)
pH, UA: 6 (ref 5.0–7.5)

## 2024-06-04 NOTE — Progress Notes (Signed)
° ° °  [facility] ESWL POSTING SHEET        Patient Name: Rebecca Owens [facility]  DOB: 07/06/1985  MRN: [id number]  Surgeon:  Glendia Barba, MD  Diagnosis:  Right Ureteral Stone  CPT: 49409  ESWL DATE: 06/10/2024  ESWL TIME: 2pm  Special Needs/Requirements: No       Cardiac/Medical/Pulmonary Clearance needed: no       Form Faxed to [facility]- [phone] Date:   Date: 06/04/2024       Form Faxed to [place]- [phone]  Date:  Date: 06/04/2024           Copy Made for Insurance PA:  Date: 06/04/2024       Orders Entered in to Epic:  Date: 06/04/2024

## 2024-06-04 NOTE — Progress Notes (Signed)
 ESWL ORDER FORM  Expected date of procedure: Next available  Surgeon: MD on truck  Post op standing: 2-4wk follow up w/KUB prior  Anticoagulation/Aspirin/NSAID standing order: Hold all 72 hours prior  Anesthesia standing order: MAC  VTE standing: SCD's  Dx: Right Ureteral Stone  Procedure: right Extracorporeal shock wave lithotripsy  CPT : 50590  Standing Order Set:   *NPO after mn, KUB  *NS 140ml/hr, Keflex  500mg  PO, Benadryl  25mg  PO, Valium  10mg  PO, Zofran  4mg  IV  Medications if other than standing orders:   NONE

## 2024-06-10 ENCOUNTER — Ambulatory Visit
Admission: RE | Admit: 2024-06-10 | Discharge: 2024-06-10 | Disposition: A | Discharge status: Home / Self Care | Attending: Urology | Admitting: Urology

## 2024-06-10 ENCOUNTER — Other Ambulatory Visit: Payer: Self-pay

## 2024-06-10 ENCOUNTER — Encounter: Admission: RE | Disposition: A | Payer: Self-pay | Attending: Urology

## 2024-06-10 ENCOUNTER — Ambulatory Visit: Admitting: Physician Assistant

## 2024-06-10 ENCOUNTER — Encounter: Payer: Self-pay | Admitting: Urology

## 2024-06-10 ENCOUNTER — Ambulatory Visit

## 2024-06-10 DIAGNOSIS — N201 Calculus of ureter: Secondary | ICD-10-CM | POA: Insufficient documentation

## 2024-06-10 DIAGNOSIS — Z79899 Other long term (current) drug therapy: Secondary | ICD-10-CM | POA: Insufficient documentation

## 2024-06-10 HISTORY — PX: EXTRACORPOREAL SHOCK WAVE LITHOTRIPSY: SHX1557

## 2024-06-10 LAB — POCT PREGNANCY, URINE: Preg Test, Ur: NEGATIVE

## 2024-06-10 SURGERY — LITHOTRIPSY, ESWL
Anesthesia: Moderate Sedation | Laterality: Right

## 2024-06-10 MED ORDER — DIAZEPAM 5 MG PO TABS
10.0000 mg | ORAL_TABLET | ORAL | Status: AC
  Administered 2024-06-10: 12:00:00 10 mg via ORAL

## 2024-06-10 MED ORDER — ONDANSETRON HCL 4 MG/2ML IJ SOLN
INTRAMUSCULAR | Status: AC
  Filled 2024-06-10: qty 2

## 2024-06-10 MED ORDER — DIAZEPAM 5 MG PO TABS
ORAL_TABLET | ORAL | Status: AC
  Filled 2024-06-10: qty 2

## 2024-06-10 MED ORDER — DIPHENHYDRAMINE HCL 25 MG PO CAPS
ORAL_CAPSULE | ORAL | Status: AC
  Filled 2024-06-10: qty 1

## 2024-06-10 MED ORDER — CEPHALEXIN 500 MG PO CAPS
ORAL_CAPSULE | ORAL | Status: AC
  Filled 2024-06-10: qty 1

## 2024-06-10 MED ORDER — SODIUM CHLORIDE 0.9 % IV SOLN: INTRAVENOUS | Status: DC

## 2024-06-10 MED ADMIN — Ondansetron HCl Inj 4 MG/2ML (2 MG/ML): 4 mg | INTRAVENOUS | @ 12:00:00 | NDC 00409475518

## 2024-06-10 MED ADMIN — Diphenhydramine HCl Cap 25 MG: 25 mg | ORAL | @ 12:00:00 | NDC 70677101501

## 2024-06-10 MED ADMIN — Cephalexin Cap 500 MG: 500 mg | ORAL | @ 12:00:00 | NDC 50268015211

## 2024-06-10 NOTE — Interval H&P Note (Signed)
 History and Physical Interval Note:  06/10/2024 2:03 PM  [facility]  has presented today for surgery, with the diagnosis of Right Ureteral Stone.  The various methods of treatment have been discussed with the patient and family. After consideration of risks, benefits and other options for treatment, the patient has consented to  Procedures: LITHOTRIPSY, ESWL (Right) as a surgical intervention.  The patient's history has been reviewed, patient examined, no change in status, stable for surgery.  I have reviewed the patient's chart and labs.  Questions were answered to the patient's satisfaction.    CV:RRR Lungs:clear  Rebecca Owens

## 2024-06-10 NOTE — Discharge Instructions (Addendum)
 As per the [facility] discharge instructions Continue pain medication as needed Continue tamsulosin  as needed Call [facility] at [phone] for pain not controlled with oral medications or fever greater than 101 degrees You have a follow-up appointment scheduled 07/01/2024

## 2024-06-11 ENCOUNTER — Encounter: Payer: Self-pay | Admitting: Urology

## 2024-06-17 ENCOUNTER — Other Ambulatory Visit: Payer: Self-pay

## 2024-06-17 ENCOUNTER — Emergency Department
Admission: EM | Admit: 2024-06-17 | Discharge: 2024-06-17 | Disposition: A | Discharge status: Home / Self Care | Attending: Emergency Medicine | Admitting: Emergency Medicine

## 2024-06-17 DIAGNOSIS — N3001 Acute cystitis with hematuria: Secondary | ICD-10-CM | POA: Insufficient documentation

## 2024-06-17 LAB — URINALYSIS, ROUTINE W REFLEX MICROSCOPIC
Bilirubin Urine: NEGATIVE
Glucose, UA: NEGATIVE mg/dL
Ketones, ur: NEGATIVE mg/dL
Leukocytes,Ua: NEGATIVE
Nitrite: NEGATIVE
Protein, ur: 30 mg/dL — AB
RBC / HPF: >50 RBC/hpf (ref 0–5)
Specific Gravity, Urine: 1.016 (ref 1.005–1.030)
pH: 6 (ref 5.0–8.0)

## 2024-06-17 LAB — CBC WITH DIFFERENTIAL/PLATELET
Abs Immature Granulocytes: 0.02 K/uL (ref 0.00–0.07)
Basophils Absolute: 0.1 K/uL (ref 0.0–0.1)
Basophils Relative: 1 %
Eosinophils Absolute: 0.2 K/uL (ref 0.0–0.5)
Eosinophils Relative: 4 %
HCT: 34.8 % — ABNORMAL LOW (ref 36.0–46.0)
Hemoglobin: 12 g/dL (ref 12.0–15.0)
Immature Granulocytes: 0 %
Lymphocytes Relative: 31 %
Lymphs Abs: 2.1 K/uL (ref 0.7–4.0)
MCH: 31.1 pg (ref 26.0–34.0)
MCHC: 34.5 g/dL (ref 30.0–36.0)
MCV: 90.2 fL (ref 80.0–100.0)
Monocytes Absolute: 0.5 K/uL (ref 0.1–1.0)
Monocytes Relative: 8 %
Neutro Abs: 3.7 K/uL (ref 1.7–7.7)
Neutrophils Relative %: 56 %
Platelets: 215 K/uL (ref 150–400)
RBC: 3.86 MIL/uL — ABNORMAL LOW (ref 3.87–5.11)
RDW: 11.8 % (ref 11.5–15.5)
WBC: 6.6 K/uL (ref 4.0–10.5)
nRBC: 0 % (ref 0.0–0.2)

## 2024-06-17 LAB — POC URINE PREG, ED: Preg Test, Ur: NEGATIVE

## 2024-06-17 LAB — BASIC METABOLIC PANEL WITH GFR
Anion gap: 10 (ref 5–15)
BUN: 20 mg/dL (ref 6–20)
CO2: 26 mmol/L (ref 22–32)
Calcium: 8.9 mg/dL (ref 8.9–10.3)
Chloride: 105 mmol/L (ref 98–111)
Creatinine, Ser: 0.86 mg/dL (ref 0.44–1.00)
GFR, Estimated: >60 mL/min
Glucose, Bld: 85 mg/dL (ref 70–99)
Potassium: 3.8 mmol/L (ref 3.5–5.1)
Sodium: 140 mmol/L (ref 135–145)

## 2024-06-17 MED ORDER — CEPHALEXIN 500 MG PO CAPS
500.0000 mg | ORAL_CAPSULE | Freq: Once | ORAL | Status: AC
  Administered 2024-06-17: 500 mg via ORAL
  Filled 2024-06-17: qty 1

## 2024-06-17 MED ORDER — CEPHALEXIN 500 MG PO CAPS: 500.0000 mg | ORAL_CAPSULE | Freq: Two times a day (BID) | ORAL | 0 refills | Status: AC

## 2024-06-17 NOTE — Discharge Instructions (Addendum)
 You have been seen in the Emergency Department (ED) today for pain when urinating.  Your workup today suggests that you have a urinary tract infection (UTI).  Please take your antibiotic as prescribed and over-the-counter pain medication (Tylenol  or Motrin ) as needed, but no more than recommended on the label instructions.  Drink PLENTY of fluids.  Call your regular doctor to schedule the next available appointment to follow up on todays ED visit, or return immediately to the ED if your pain worsens, you have decreased urine production, develop fever, persistent vomiting, or other symptoms that concern you.   Follow up with your urologist as scheduled in January w/ PA-C Samantha Vaillancourt.

## 2024-06-17 NOTE — ED Triage Notes (Signed)
 Pt top ED for urinary retention and R flank pain since today. Had lithotripsy 1 week ago. States has been peeing drops for the last 6 hours, maybe total output during that time.   Bladder scan in triage reveals 32mL.

## 2024-06-17 NOTE — ED Provider Notes (Signed)
 "  [facility] Provider Note    Event Date/Time   First MD Initiated Contact with Patient 06/17/24 1600     (approximate)   History   Urinary Retention and Flank Pain   HPI  Rebecca Owens is a 38 y.o. female  with a past medical history of nephrolithiasis, AUB with IUD, lumbar radiculopathy presents to the emergency department with urinary urgency that started today.  She reports she had a brief 30-minute episode of right flank pain today but is now having no pain at all.  She reports she has been peeing drops of urine today for the last 6 hours with only roughly 100 mL of total output.  She denies fever, chills, increased urinary frequency or dysuria, abdominal pain, vomiting, diarrhea, nausea, abnormal vaginal discharge or bleeding today.  She had lithotripsy completed 1 week ago w/ Dr. Francisca for right ureteral stone.  Physical Exam   Triage Vital Signs: ED Triage Vitals  Encounter Vitals Group     BP 06/17/24 1528 115/85     Girls Systolic BP Percentile --      Girls Diastolic BP Percentile --      Boys Systolic BP Percentile --      Boys Diastolic BP Percentile --      Pulse Rate 06/17/24 1525 86     Resp 06/17/24 1525 20     Temp 06/17/24 1525 97.8 F (36.6 C)     Temp Source 06/17/24 1525 Oral     SpO2 06/17/24 1525 100 %     Weight 06/17/24 1527 200 lb 9.9 oz (91 kg)     Height 06/17/24 1527 5' 5 (1.651 m)     Head Circumference --      Peak Flow --      Pain Score 06/17/24 1526 7     Pain Loc --      Pain Education --      Exclude from Growth Chart --     Most recent vital signs: Vitals:   06/17/24 1525 06/17/24 1528  BP:  115/85  Pulse: 86   Resp: 20   Temp: 97.8 F (36.6 C)   SpO2: 100%     General: Awake, in no acute distress. Appears stated age. CV: Good peripheral perfusion. Respiratory:Normal respiratory effort.  No respiratory distress.  GI: Soft, non-distended, non-tender. No rebound or guarding. Skin:Warm, dry,  intact. No rashes, lesions, or ecchymosis.  Neurological: A&Ox4 to person, place, time, and situation.  No CVA tenderness b/l.  ED Results / Procedures / Treatments   Labs (all labs ordered are listed, but only abnormal results are displayed) Labs Reviewed  URINALYSIS, ROUTINE W REFLEX MICROSCOPIC - Abnormal; Notable for the following components:      Result Value   Color, Urine YELLOW (*)    APPearance HAZY (*)    Hgb urine dipstick LARGE (*)    Protein, ur 30 (*)    Bacteria, UA RARE (*)    All other components within normal limits  CBC WITH DIFFERENTIAL/PLATELET - Abnormal; Notable for the following components:   RBC 3.86 (*)    HCT 34.8 (*)    All other components within normal limits  BASIC METABOLIC PANEL WITH GFR  POC URINE PREG, ED     EKG     RADIOLOGY    PROCEDURES:  Critical Care performed: No   Procedures   MEDICATIONS ORDERED IN ED: Medications  cephALEXin  (KEFLEX ) capsule 500 mg (has no administration in time  range)     IMPRESSION / MDM / ASSESSMENT AND PLAN / ED COURSE  I reviewed the triage vital signs and the nursing notes.                              Differential diagnosis includes, but is not limited to, UTI, dehydration, post-lithotripsy complication   Patient's presentation is most consistent with acute complicated illness / injury requiring diagnostic workup.  Patient is a 38 year old female presenting with urinary urgency that started today. Bladder scan completed in triage with 32 mL.  She does not have any abdominal or back tenderness.  She is afebrile and well-appearing.  She appears hydrated with moist mucous membranes.  Her BMP is unremarkable.  CBC is unremarkable.  UA shows large hemoglobin, proteinuria, rare bacteria, 0-5 WBCs and greater than 50 RBCs.  UPT is negative.  Discussed case with Dr. Waymond, who provides a physician.  Will treat for UTI given symptom of urinary urgency with Keflex .  Discussed increase fluid intake.  She  has an appointment scheduled in early January for follow-up with urology.  I will have her keep that appointment and follow-up with her primary care provider as needed for any concerns or continued symptoms after treatment is completed.  She will return to the emergency department for any new abdominal or back pain, vomiting, fever or any other new or worsening symptoms.  Patient was given the opportunity to ask questions; all questions were answered. Emergency department return precautions were discussed with the patient.  Patient is in agreement to the treatment plan.  Patient is stable for discharge.   FINAL CLINICAL IMPRESSION(S) / ED DIAGNOSES   Final diagnoses:  Acute cystitis with hematuria     Rx / DC Orders   ED Discharge Orders          Ordered    cephALEXin  (KEFLEX ) 500 MG capsule  2 times daily        06/17/24 1803             Note:  This document was prepared using Dragon voice recognition software and may include unintentional dictation errors.     Sheron Salm, PA-C 06/17/24 1806    Waymond Lorelle Cummins, MD 06/17/24 [phone]  "

## 2024-06-18 ENCOUNTER — Other Ambulatory Visit: Payer: Self-pay

## 2024-06-18 ENCOUNTER — Emergency Department

## 2024-06-18 ENCOUNTER — Emergency Department
Admission: EM | Admit: 2024-06-18 | Discharge: 2024-06-18 | Disposition: A | Discharge status: Home / Self Care | Attending: Emergency Medicine | Admitting: Emergency Medicine

## 2024-06-18 DIAGNOSIS — N132 Hydronephrosis with renal and ureteral calculous obstruction: Secondary | ICD-10-CM | POA: Insufficient documentation

## 2024-06-18 DIAGNOSIS — R109 Unspecified abdominal pain: Secondary | ICD-10-CM | POA: Diagnosis present

## 2024-06-18 DIAGNOSIS — R10A1 Flank pain, right side: Secondary | ICD-10-CM

## 2024-06-18 DIAGNOSIS — N200 Calculus of kidney: Secondary | ICD-10-CM

## 2024-06-18 LAB — URINALYSIS, ROUTINE W REFLEX MICROSCOPIC
Bacteria, UA: NONE SEEN
Bilirubin Urine: NEGATIVE
Glucose, UA: NEGATIVE mg/dL
Ketones, ur: NEGATIVE mg/dL
Leukocytes,Ua: NEGATIVE
Nitrite: NEGATIVE
Protein, ur: NEGATIVE mg/dL
RBC / HPF: >50 RBC/hpf (ref 0–5)
Specific Gravity, Urine: 1.021 (ref 1.005–1.030)
Squamous Epithelial / HPF: 0 /HPF (ref 0–5)
pH: 7 (ref 5.0–8.0)

## 2024-06-18 LAB — BASIC METABOLIC PANEL WITH GFR
Anion gap: 9 (ref 5–15)
BUN: 23 mg/dL — ABNORMAL HIGH (ref 6–20)
CO2: 25 mmol/L (ref 22–32)
Calcium: 9.1 mg/dL (ref 8.9–10.3)
Chloride: 105 mmol/L (ref 98–111)
Creatinine, Ser: 0.92 mg/dL (ref 0.44–1.00)
GFR, Estimated: >60 mL/min
Glucose, Bld: 97 mg/dL (ref 70–99)
Potassium: 4.4 mmol/L (ref 3.5–5.1)
Sodium: 139 mmol/L (ref 135–145)

## 2024-06-18 LAB — CBC
HCT: 37.1 % (ref 36.0–46.0)
Hemoglobin: 12.5 g/dL (ref 12.0–15.0)
MCH: 31 pg (ref 26.0–34.0)
MCHC: 33.7 g/dL (ref 30.0–36.0)
MCV: 92.1 fL (ref 80.0–100.0)
Platelets: 218 K/uL (ref 150–400)
RBC: 4.03 MIL/uL (ref 3.87–5.11)
RDW: 11.8 % (ref 11.5–15.5)
WBC: 6.7 K/uL (ref 4.0–10.5)
nRBC: 0 % (ref 0.0–0.2)

## 2024-06-18 LAB — POC URINE PREG, ED: Preg Test, Ur: NEGATIVE

## 2024-06-18 MED ORDER — OXYCODONE-ACETAMINOPHEN 5-325 MG PO TABS
1.0000 | ORAL_TABLET | Freq: Once | ORAL | Status: AC
  Administered 2024-06-18: 1 via ORAL
  Filled 2024-06-18: qty 1

## 2024-06-18 MED ORDER — TRAMADOL HCL 50 MG PO TABS
50.0000 mg | ORAL_TABLET | Freq: Once | ORAL | Status: AC
  Administered 2024-06-18: 50 mg via ORAL
  Filled 2024-06-18: qty 1

## 2024-06-18 MED ORDER — METOCLOPRAMIDE HCL 10 MG PO TABS
10.0000 mg | ORAL_TABLET | Freq: Once | ORAL | Status: AC
  Administered 2024-06-18: 10 mg via ORAL
  Filled 2024-06-18: qty 1

## 2024-06-18 MED ORDER — OXYCODONE-ACETAMINOPHEN 5-325 MG PO TABS: 1.0000 | ORAL_TABLET | ORAL | 0 refills | Status: AC | PRN

## 2024-06-18 MED ORDER — KETOROLAC TROMETHAMINE 10 MG PO TABS: 10.0000 mg | ORAL_TABLET | Freq: Four times a day (QID) | ORAL | 0 refills | Status: AC | PRN

## 2024-06-18 MED ORDER — KETOROLAC TROMETHAMINE 60 MG/2ML IM SOLN
30.0000 mg | Freq: Once | INTRAMUSCULAR | Status: AC
  Administered 2024-06-18: 30 mg via INTRAMUSCULAR
  Filled 2024-06-18: qty 2

## 2024-06-18 MED ORDER — KETOROLAC TROMETHAMINE 30 MG/ML IJ SOLN
30.0000 mg | Freq: Once | INTRAMUSCULAR | Status: AC
  Administered 2024-06-18: 30 mg via INTRAMUSCULAR
  Filled 2024-06-18: qty 1

## 2024-06-18 NOTE — Discharge Instructions (Addendum)
 Please take your pain medication as needed but only as prescribed.  Do not drink alcohol or drive while taking pain medication.  Please follow-up with your urologist regarding today's ER visit and your continued right flank pain.  Return to the emergency department for any fever, worsening pain or any other symptom personally concerning to yourself.  Please call urology and make an appointment for a follow-up on Monday.  Please take Toradol  1 tablet by mouth every 6 hours as needed for pain.

## 2024-06-18 NOTE — ED Triage Notes (Signed)
 1130 pain returned and is getting worse, pt states that she had lithotripsy last thursday. Denies any known fevers, taking tramadol  without relief, pt states that she passed some stuff over the weekend but nothing since

## 2024-06-18 NOTE — ED Provider Notes (Addendum)
 ----------------------------------------- 7:51 PM on 06/18/2024 -----------------------------------------  Blood pressure 117/71, pulse 72, temperature 97.9 F (36.6 C), temperature source Oral, resp. rate 18, height 5' 5 (1.651 m), weight 91 kg, SpO2 98%, unknown if currently breastfeeding.  Assuming care from Dr. Dorothyann, .  In short, Rebecca Owens is a 38 y.o. female with a chief complaint of Flank Pain .  Refer to the original H&P for additional details.  The current plan of care is to wait for official report of CT scan to determine disposition..  ____________________________________________    ED Results / Procedures / Treatments   Labs (all labs ordered are listed, but only abnormal results are displayed) Labs Reviewed  URINALYSIS, ROUTINE W REFLEX MICROSCOPIC - Abnormal; Notable for the following components:      Result Value   Color, Urine YELLOW (*)    APPearance HAZY (*)    Hgb urine dipstick MODERATE (*)    All other components within normal limits  BASIC METABOLIC PANEL WITH GFR - Abnormal; Notable for the following components:   BUN 23 (*)    All other components within normal limits  CBC  POC URINE PREG, ED   RADIOLOGY  I personally viewed and evaluated these images as part of my medical decision making, as well as reviewing the written report by the radiologist.  ED Provider Interpretation:   CT Renal Stone Study Result Date: 06/18/2024 EXAM: CT UROGRAM 06/18/2024 06:18:00 PM TECHNIQUE: CT of the abdomen and pelvis was performed without the administration of intravenous contrast as per CT urogram protocol. Multiplanar reformatted images as well as MIP urogram images are provided for review. Automated exposure control, iterative reconstruction, and/or weight based adjustment of the mA/kV was utilized to reduce the radiation dose to as low as reasonably achievable. COMPARISON: None available. CLINICAL HISTORY: Abdominal/flank pain, stone suspected;  lithotripsy recently now with worsening pain. FINDINGS: LOWER CHEST: No acute abnormality. LIVER: The liver is unremarkable. GALLBLADDER AND BILE DUCTS: Status post cholecystectomy. No biliary ductal dilatation. SPLEEN: No acute abnormality. PANCREAS: No acute abnormality. ADRENAL GLANDS: No acute abnormality. KIDNEYS, URETERS AND BLADDER: Right nephrolithiasis measuring at 4 mm. Left nephrolithiasis measuring up to 5 mm. 4 mm calcified stone at the right ureterovesicular junction with associated mild hydronephrosis proximally. No left ureterolithiasis. T-shaped internal device in appropriate position. No perinephric or periureteral stranding. Urinary bladder is unremarkable. GI AND BOWEL: Stomach demonstrates no acute abnormality. No small or large bowel thickening or dilatation. The appendix is unremarkable. There is no bowel obstruction. PERITONEUM AND RETROPERITONEUM: No ascites. No free air. VASCULATURE: Aorta is normal in caliber. LYMPH NODES: No lymphadenopathy. REPRODUCTIVE ORGANS: The uterus is otherwise unremarkable. No adnexal mass. BONES AND SOFT TISSUES: No acute osseous abnormality. No focal soft tissue abnormality. IMPRESSION: 1. Obstructive 4 mm right ureterovesicular junction stone. 2. Nonobstructive bilateral nephrolithiasis. Electronically signed by: Morgane Naveau MD 06/18/2024 08:02 PM EST RP Workstation: [id number]     PROCEDURES:  Critical Care performed: No  Procedures   MEDICATIONS ORDERED IN ED: Medications  ketorolac  (TORADOL ) 30 MG/ML injection 30 mg (30 mg Intramuscular Given 06/18/24 1705)  oxyCODONE -acetaminophen  (PERCOCET/ROXICET) 5-325 MG per tablet 1 tablet (1 tablet Oral Given 06/18/24 1805)  oxyCODONE -acetaminophen  (PERCOCET/ROXICET) 5-325 MG per tablet 1 tablet (1 tablet Oral Given 06/18/24 1850)  metoCLOPramide  (REGLAN ) tablet 10 mg (10 mg Oral Given 06/18/24 1849)  ketorolac  (TORADOL ) injection 30 mg (30 mg Intramuscular Given 06/18/24 1852)  traMADol  (ULTRAM )  tablet 50 mg (50 mg Oral Given 06/18/24 2053)  IMPRESSION / MDM / ASSESSMENT AND PLAN / ED COURSE  I reviewed the triage vital signs and the nursing notes.                              Differential diagnosis includes, but is not limited to, renal colic, nephrolithiasis, pyelonephritis, ovarian torsion  Patient's presentation is most consistent with acute complicated illness / injury requiring diagnostic workup.   Patient's diagnosis is consistent with nephrolithiasis, right flank pain, obstructive ureterovesical junction stone. Patient will be discharged home with prescriptions for Percocet. Patient is to follow up with urology as needed or otherwise directed. Patient is given ED precautions to return to the ED for any worsening or new symptoms.  Clinical Course as of 06/18/24 2139  Fri Jun 18, 2024  2034 CT Renal Stone Study  Obstructive 4 mm right ureterovesicular junction stone. 2. Nonobstructive bilateral nephrolithiasis.   [AE]  2034 Reassess the patient, she continues with renal colic, 6/10.  I will order tramadol .  Patient had Toradol  and Percocet 2 hours ago. [AE]  2046 Urinalysis, Routine w reflex microscopic -Urine, Clean Catch(!) UA negative for UTI, hemoglobin is present for [AE]  2046 Basic metabolic panel(!) Electrolytes within normal limits.  BUN increased 23.  GFR within normal limit [AE]  2046 POC urine preg, ED Negative [AE]  2047 CBC White blood cells, hemoglobin and platelets within normal limits. [AE]  2133 Reassessed the patient after tramadol , patient endorses resolution of the pain.  Patient is going to be discharged with Percocet and Toradol .  Patient is agreeable with the plan. [AE]    Clinical Course User Index [AE] Janit Kast, PA-C    FINAL CLINICAL IMPRESSION(S) / ED DIAGNOSES   Final diagnoses:  Right flank pain  Nephrolithiasis     Rx / DC Orders   ED Discharge Orders          Ordered    oxyCODONE -acetaminophen  (PERCOCET)  5-325 MG tablet  Every 4 hours PRN        06/18/24 1946    ketorolac  (TORADOL ) 10 MG tablet  Every 6 hours PRN       Note to Pharmacy: Patient given an IM/IV loading dose in emergency department   06/18/24 2138             Note:  This document was prepared using Dragon voice recognition software and may include unintentional dictation errors.    Janit Kast, PA-C 06/18/24 2044    Janit Kast, PA-C 06/18/24 2139    Viviann Pastor, MD 06/19/24 [phone]

## 2024-06-18 NOTE — ED Provider Notes (Signed)
 "  [facility] Provider Note    Event Date/Time   First MD Initiated Contact with Patient 06/18/24 1738     (approximate)  History   Chief Complaint: Flank Pain  HPI  Rebecca Owens is a 38 y.o. female with a past medical history of kidney stones who presents to the emergency department for right flank pain.  According to the patient she had a lithotripsy performed 06/10/2024.  States she was doing better but now has begun experiencing more significant right flank pain.  States she came yesterday for the same but the pain went away and she ended up going home.  Today the pain has not dissipated.  Patient denies any painful urination.  No fever.  Physical Exam   Triage Vital Signs: ED Triage Vitals  Encounter Vitals Group     BP 06/18/24 1559 122/84     Girls Systolic BP Percentile --      Girls Diastolic BP Percentile --      Boys Systolic BP Percentile --      Boys Diastolic BP Percentile --      Pulse Rate 06/18/24 1559 93     Resp 06/18/24 1559 19     Temp 06/18/24 1559 98 F (36.7 C)     Temp Source 06/18/24 1559 Oral     SpO2 06/18/24 1559 100 %     Weight 06/18/24 1602 200 lb 9.9 oz (91 kg)     Height 06/18/24 1602 5' 5 (1.651 m)     Head Circumference --      Peak Flow --      Pain Score 06/18/24 1602 8     Pain Loc --      Pain Education --      Exclude from Growth Chart --     Most recent vital signs: Vitals:   06/18/24 1559  BP: 122/84  Pulse: 93  Resp: 19  Temp: 98 F (36.7 C)  SpO2: 100%    General: Awake, no distress.  CV:  Good peripheral perfusion.  Regular rate and rhythm  Resp:  Normal effort.  Equal breath sounds bilaterally.  Abd:  No distention.  Soft, minimal right side/flank tenderness.  ED Results / Procedures / Treatments   RADIOLOGY  I have reviewed interpreted CT images.  Patient appears to have send following ureteral stones including at the right UVJ.   MEDICATIONS ORDERED IN ED: Medications   oxyCODONE -acetaminophen  (PERCOCET/ROXICET) 5-325 MG per tablet 1 tablet (has no administration in time range)  ketorolac  (TORADOL ) 30 MG/ML injection 30 mg (30 mg Intramuscular Given 06/18/24 1705)     IMPRESSION / MDM / ASSESSMENT AND PLAN / ED COURSE  I reviewed the triage vital signs and the nursing notes.  Patient's presentation is most consistent with acute presentation with potential threat to life or bodily function.  Patient presents emergency department for return of/worsening right flank pain.  Patient had a lithotripsy performed 12/18.  States she has had several small pieces of stone she believes in the urine but the pain returned yesterday was fairly significant and went away on its own but then returned again today and has not dissipated.  Patient's lab work today shows a reassuring CBC with a normal white blood cell count reassuring chemistry, urinalysis shows blood but no white cells no bacteria.  Pregnancy test is negative.  Given recurrent pain and worsening pain now we will repeat a CT scan to evaluate for continued ureterolithiasis and to evaluate  the size of the remaining fragments.  CT scan is pending.  Patient care signed out oncoming provider.  Plan to likely discharge home.  FINAL CLINICAL IMPRESSION(S) / ED DIAGNOSES   Right flank pain Ureterolithiasis.  Note:  This document was prepared using Dragon voice recognition software and may include unintentional dictation errors.   Dorothyann Drivers, MD 06/19/24 2250  "

## 2024-07-01 ENCOUNTER — Ambulatory Visit
Admission: RE | Admit: 2024-07-01 | Discharge: 2024-07-01 | Disposition: A | Discharge status: Home / Self Care | Source: Ambulatory Visit | Attending: Urology | Admitting: Urology

## 2024-07-01 ENCOUNTER — Ambulatory Visit: Admitting: Physician Assistant

## 2024-07-01 ENCOUNTER — Encounter: Payer: Self-pay | Admitting: Physician Assistant

## 2024-07-01 ENCOUNTER — Ambulatory Visit
Admission: RE | Admit: 2024-07-01 | Discharge: 2024-07-01 | Disposition: A | Discharge status: Home / Self Care | Attending: Urology | Admitting: Urology

## 2024-07-01 VITALS — BP 112/75 | HR 76 | Ht 65.0 in | Wt 198.0 lb

## 2024-07-01 DIAGNOSIS — N201 Calculus of ureter: Secondary | ICD-10-CM

## 2024-07-01 LAB — URINALYSIS, COMPLETE
Bilirubin, UA: NEGATIVE
Glucose, UA: NEGATIVE
Ketones, UA: NEGATIVE
Leukocytes,UA: NEGATIVE
Nitrite, UA: NEGATIVE
Protein,UA: NEGATIVE
Specific Gravity, UA: 1.025 (ref 1.005–1.030)
Urobilinogen, Ur: 0.2 mg/dL (ref 0.2–1.0)
pH, UA: 6 (ref 5.0–7.5)

## 2024-07-01 LAB — MICROSCOPIC EXAMINATION: RBC, Urine: >30 /HPF — AB (ref 0–2)

## 2024-07-01 NOTE — Progress Notes (Signed)
 "  07/01/2024 2:52 PM   Rebecca Owens [facility] Apr 07, 1986 [id number]  CC: Chief Complaint  Patient presents with   Other    Left Ureteral stone   HPI: Rebecca Owens is a 39 y.o. female with PMH nephrolithiasis who underwent ESWL with Dr. Twylla on 06/10/2024 for management of a 5 mm proximal right ureteral stone who presents today for follow up.   She was seen in the ED on 06/17/2024 for postop pain.  CT stone study at that time showed a 4 mm right UVJ fragment.  Today she reports she passed a large stone fragment after her ED visit about 9 days ago and is now asymptomatic.  KUB today with a possible stable 4 mm distal right ureteral fragment.  In-office UA today positive for 2+ blood; urine microscopy with >30 RBCs/HPF.  PMH: Past Medical History:  Diagnosis Date   Abnormal uterine bleeding (AUB) 10/12/2019   Family history of genetic disease 07/09/2019   History of kidney stones    Influenza A 09/09/2017   Kidney stone    Recurrent pregnancy loss 07/09/2019   S/P cholecystectomy 01/2008    Surgical History: Past Surgical History:  Procedure Laterality Date   CHOLECYSTECTOMY     CYSTOSCOPY/URETEROSCOPY/HOLMIUM LASER/STENT PLACEMENT Left 10/15/2019   Procedure: CYSTOSCOPY/URETEROSCOPY/HOLMIUM LASER/STENT PLACEMENT;  Surgeon: Francisca Redell BROCKS, MD;  Location: [facility];  Service: Urology;  Laterality: Left;   DILATION AND EVACUATION N/A 05/01/2018   Procedure: DILATATION AND EVACUATION;  Surgeon: Verdon Keen, MD;  Location: [facility];  Service: Gynecology;  Laterality: N/A;   EXTRACORPOREAL SHOCK WAVE LITHOTRIPSY Left 04/12/2021   Procedure: EXTRACORPOREAL SHOCK WAVE LITHOTRIPSY (ESWL);  Surgeon: Francisca Redell BROCKS, MD;  Location: [facility];  Service: Urology;  Laterality: Left;   EXTRACORPOREAL SHOCK WAVE LITHOTRIPSY Right 06/10/2024   Procedure: LITHOTRIPSY, ESWL;  Surgeon: Twylla Glendia BROCKS, MD;  Location: [facility];  Service: Urology;  Laterality: Right;    Home Medications:   Allergies as of 07/01/2024       Reactions   Codeine Hives        Medication List        Accurate as of July 01, 2024  2:52 PM. If you have any questions, ask your nurse or doctor.          DULoxetine 20 MG capsule Commonly known as: CYMBALTA Take 20 mg by mouth.   gabapentin 300 MG capsule Commonly known as: NEURONTIN Take 300 mg by mouth at bedtime.   ketorolac  10 MG tablet Commonly known as: TORADOL  Take 1 tablet (10 mg total) by mouth every 6 (six) hours as needed.   levonorgestrel 20.1 MCG/DAY Iud IUD Commonly known as: LILETTA 1 each by Intrauterine route once.   ondansetron  4 MG tablet Commonly known as: Zofran  Take 1 tablet (4 mg total) by mouth every 8 (eight) hours as needed for nausea or vomiting.   oxyCODONE -acetaminophen  5-325 MG tablet Commonly known as: Percocet Take 1 tablet by mouth every 4 (four) hours as needed for severe pain (pain score 7-10).   tamsulosin  0.4 MG Caps capsule Commonly known as: FLOMAX  Take 1 capsule (0.4 mg total) by mouth daily. Take during kidney stone passage   traMADol  50 MG tablet Commonly known as: ULTRAM  Take 0.5 tablets (25 mg total) by mouth every 6 (six) hours as needed for severe pain (pain score 7-10).   Zepbound 12.5 MG/0.5ML Pen Generic drug: tirzepatide Inject 12.5 mg into the skin.        Allergies:  Allergies[1]  Family History: Family History  Problem Relation Age of Onset   Hyperlipidemia Father    Heart disease Paternal Grandfather     Social History:   reports that she has never smoked. She has never used smokeless tobacco. She reports that she does not currently use alcohol. She reports that she does not use drugs.  Physical Exam: BP 112/75   Pulse 76   Ht 5' 5 (1.651 m)   Wt 198 lb (89.8 kg)   BMI 32.95 kg/m   Constitutional:  Alert and oriented, no acute distress, nontoxic appearing HEENT: [place], AT Cardiovascular: No clubbing, cyanosis, or edema Respiratory: Normal  respiratory effort, no increased work of breathing Skin: No rashes, bruises or suspicious lesions Neurologic: Grossly intact, no focal deficits, moving all 4 extremities Psychiatric: Normal mood and affect  Laboratory Data: Results for orders placed or performed in visit on 07/01/24  Microscopic Examination   Collection Time: 07/01/24  2:00 PM   Urine  Result Value Ref Range   WBC, UA 0-5 0 - 5 /hpf   RBC, Urine >30 (A) 0 - 2 /hpf   Epithelial Cells (non renal) 0-10 0 - 10 /hpf   Bacteria, UA Few None seen/Few  Urinalysis, Complete   Collection Time: 07/01/24  2:00 PM  Result Value Ref Range   Specific Gravity, UA 1.025 1.005 - 1.030   pH, UA 6.0 5.0 - 7.5   Color, UA Yellow Yellow   Appearance Ur Clear Clear   Leukocytes,UA Negative Negative   Protein,UA Negative Negative/Trace   Glucose, UA Negative Negative   Ketones, UA Negative Negative   RBC, UA 2+ (A) Negative   Bilirubin, UA Negative Negative   Urobilinogen, Ur 0.2 0.2 - 1.0 mg/dL   Nitrite, UA Negative Negative   Microscopic Examination See below:    Pertinent Imaging: KUB, 07/01/2024: See Epic  I personally reviewed the images referenced above and note a right hemipelvic calcification possibly representing a stable residual stone fragment at the UVJ.  Assessment & Plan:   1. Right ureteral stone (Primary) She has passed a sizable additional fragment since her ED CT scan, however I think I see a residual fragment on KUB today.  Regardless, her UA is bland save for microscopic hematuria and she is comfortable.  Using shared decision making, we elected to have her continue Flomax  and push fluids and we will recheck her in 2 weeks with another UA and KUB prior. - Urinalysis, Complete   Return in about 2 weeks (around 07/15/2024) for Stone f/u with UA + KUB prior.  Lucie Hones, PA-C  [facility] [place] [address] [place], [place] [address] [phone]      [1]   Allergies Allergen Reactions   Codeine Hives   "

## 2024-07-15 ENCOUNTER — Other Ambulatory Visit: Payer: Self-pay | Admitting: Urology

## 2024-07-15 ENCOUNTER — Ambulatory Visit: Admitting: Urology

## 2024-07-15 DIAGNOSIS — N201 Calculus of ureter: Secondary | ICD-10-CM

## 2024-07-15 NOTE — Progress Notes (Unsigned)
 "    07/16/2024 9:08 AM   Rebecca Owens Nov 11, 1985 [id number]  Referring provider: Vertell Rogue, DO [address] [place],  [place] [address]  Urological history: 1.  Nephrolithiasis -stone composition 100% calcium oxalate monohydrate -24 hour  low urine volume of 1.73 L, supersaturation of calcium oxalate 7.77, mildly elevated urinary oxalate of 41, normal urine calcium of 167, good urine citrate of 588, and urine sodium was normal at 129 -left urs 2021 -right ESWL (05/31/2024)   No chief complaint on file.  HPI: Rebecca Owens is a 39 y.o. woman who presents today for follow up KUB.   Previous records reviewed.  She underwent right ESWL on June 10, 2024.  She did pass a good sized fragment, but there was some residual fragments left on KUB taken on July 01, 2024.  UA ***  KUB ***  PMH: Past Medical History:  Diagnosis Date   Abnormal uterine bleeding (AUB) 10/12/2019   Family history of genetic disease 07/09/2019   History of kidney stones    Influenza A 09/09/2017   Kidney stone    Recurrent pregnancy loss 07/09/2019   S/P cholecystectomy 01/2008    Surgical History: Past Surgical History:  Procedure Laterality Date   CHOLECYSTECTOMY     CYSTOSCOPY/URETEROSCOPY/HOLMIUM LASER/STENT PLACEMENT Left 10/15/2019   Procedure: CYSTOSCOPY/URETEROSCOPY/HOLMIUM LASER/STENT PLACEMENT;  Surgeon: Rebecca Redell BROCKS, MD;  Location: [facility];  Service: Urology;  Laterality: Left;   DILATION AND EVACUATION N/A 05/01/2018   Procedure: DILATATION AND EVACUATION;  Surgeon: Rebecca Keen, MD;  Location: [facility];  Service: Gynecology;  Laterality: N/A;   EXTRACORPOREAL SHOCK WAVE LITHOTRIPSY Left 04/12/2021   Procedure: EXTRACORPOREAL SHOCK WAVE LITHOTRIPSY (ESWL);  Surgeon: Rebecca Redell BROCKS, MD;  Location: [facility];  Service: Urology;  Laterality: Left;   EXTRACORPOREAL SHOCK WAVE LITHOTRIPSY Right 06/10/2024   Procedure: LITHOTRIPSY, ESWL;  Surgeon: Rebecca Glendia BROCKS, MD;  Location: [facility];  Service: Urology;  Laterality: Right;    Home Medications:  Allergies as of 07/16/2024       Reactions   Codeine Hives        Medication List        Accurate as of July 15, 2024  9:08 AM. If you have any questions, ask your nurse or doctor.          DULoxetine 20 MG capsule Commonly known as: CYMBALTA Take 20 mg by mouth.   gabapentin 300 MG capsule Commonly known as: NEURONTIN Take 300 mg by mouth at bedtime.   ketorolac  10 MG tablet Commonly known as: TORADOL  Take 1 tablet (10 mg total) by mouth every 6 (six) hours as needed.   levonorgestrel 20.1 MCG/DAY Iud IUD Commonly known as: LILETTA 1 each by Intrauterine route once.   ondansetron  4 MG tablet Commonly known as: Zofran  Take 1 tablet (4 mg total) by mouth every 8 (eight) hours as needed for nausea or vomiting.   oxyCODONE -acetaminophen  5-325 MG tablet Commonly known as: Percocet Take 1 tablet by mouth every 4 (four) hours as needed for severe pain (pain score 7-10).   tamsulosin  0.4 MG Caps capsule Commonly known as: FLOMAX  Take 1 capsule (0.4 mg total) by mouth daily. Take during kidney stone passage   traMADol  50 MG tablet Commonly known as: ULTRAM  Take 0.5 tablets (25 mg total) by mouth every 6 (six) hours as needed for severe pain (pain score 7-10).   Zepbound 12.5 MG/0.5ML Pen Generic drug: tirzepatide Inject 12.5 mg into the skin.  Allergies: Allergies[1]  Family History: Family History  Problem Relation Age of Onset   Hyperlipidemia Father    Heart disease Paternal Grandfather     Social History:  reports that she has never smoked. She has never used smokeless tobacco. She reports that she does not currently use alcohol. She reports that she does not use drugs.  ROS: Pertinent ROS in HPI  Physical Exam: There were no vitals taken for this visit.  Constitutional:  Well nourished. Alert and oriented, No acute distress. HEENT: Rebecca Owens AT,  moist mucus membranes.  Trachea midline, no masses. Cardiovascular: No clubbing, cyanosis, or edema. Respiratory: Normal respiratory effort, no increased work of breathing. GU: No CVA tenderness.  No bladder fullness or masses.  Recession of labia minora, dry, pale vulvar vaginal mucosa and loss of mucosal ridges and folds.  Normal urethral meatus, no lesions, no prolapse, no discharge.   No urethral masses, tenderness and/or tenderness. No bladder fullness, tenderness or masses. *** vagina mucosa, *** estrogen effect, no discharge, no lesions, *** pelvic support, *** cystocele and *** rectocele noted.  No cervical motion tenderness.  Uterus is freely mobile and non-fixed.  No adnexal/parametria masses or tenderness noted.  Anus and perineum are without rashes or lesions.   ***  Neurologic: Grossly intact, no focal deficits, moving all 4 extremities. Psychiatric: Normal mood and affect.    Laboratory Data: See Epic and HPI   I have reviewed the labs.   Pertinent Imaging: KUB, radiologist interpretation still pending  I have independently reviewed the films.  See HPI.   Assessment & Plan:  ***  1. Right ureteral stone - UA *** - KUB ***   No follow-ups on file.  These notes generated with voice recognition software. I apologize for typographical errors.  Rebecca CORNWALL, PA-C  [facility] [address]  [address] [place], [place] [address] [phone]     [1]  Allergies Allergen Reactions   Codeine Hives   "

## 2024-07-16 ENCOUNTER — Ambulatory Visit: Admission: RE | Admit: 2024-07-16 | Discharge: 2024-07-16 | Disposition: A | Attending: Urology | Admitting: Urology

## 2024-07-16 ENCOUNTER — Ambulatory Visit

## 2024-07-16 ENCOUNTER — Ambulatory Visit
Admission: RE | Admit: 2024-07-16 | Discharge: 2024-07-16 | Disposition: A | Source: Ambulatory Visit | Attending: Urology

## 2024-07-16 VITALS — BP 120/77 | Wt 198.0 lb

## 2024-07-16 DIAGNOSIS — N201 Calculus of ureter: Secondary | ICD-10-CM

## 2024-07-16 LAB — URINALYSIS, COMPLETE
Bilirubin, UA: NEGATIVE
Glucose, UA: NEGATIVE
Leukocytes,UA: NEGATIVE
Nitrite, UA: NEGATIVE
Specific Gravity, UA: 1.025 (ref 1.005–1.030)
Urobilinogen, Ur: 0.2 mg/dL (ref 0.2–1.0)
pH, UA: 6 (ref 5.0–7.5)

## 2024-07-16 LAB — MICROSCOPIC EXAMINATION

## 2024-07-30 LAB — STONE ANALYSIS
Calcium Oxalate Dihydrate: 20 %
Calcium Oxalate Monohydrate: 75 %
Calcium Phosphate (Hydroxyl): 5 %
Weight Calculi: 120 mg

## 2025-05-25 ENCOUNTER — Ambulatory Visit: Admitting: Urology
